# Patient Record
Sex: Female | Born: 1990
Health system: Southern US, Community
[De-identification: ages and names within clinical notes are randomized; demographics above are authoritative.]

## PROBLEM LIST (undated history)

## (undated) DIAGNOSIS — E079 Disorder of thyroid, unspecified: Secondary | ICD-10-CM

## (undated) DIAGNOSIS — F32A Depression, unspecified: Secondary | ICD-10-CM

## (undated) DIAGNOSIS — D649 Anemia, unspecified: Secondary | ICD-10-CM

## (undated) DIAGNOSIS — D126 Benign neoplasm of colon, unspecified: Secondary | ICD-10-CM

## (undated) DIAGNOSIS — F419 Anxiety disorder, unspecified: Secondary | ICD-10-CM

## (undated) HISTORY — DX: Anxiety disorder, unspecified: F41.9

## (undated) HISTORY — DX: Disorder of thyroid, unspecified: E07.9

## (undated) HISTORY — DX: Anemia, unspecified: D64.9

## (undated) HISTORY — PX: SMALL INTESTINE SURGERY: SHX150

## (undated) HISTORY — PX: NO PAST SURGERIES: SHX2092

## (undated) HISTORY — DX: Depression, unspecified: F32.A

## (undated) HISTORY — DX: Benign neoplasm of colon, unspecified: D12.6

---

## 2016-01-30 ENCOUNTER — Emergency Department (HOSPITAL_COMMUNITY): Payer: Self-pay

## 2016-01-30 ENCOUNTER — Encounter (HOSPITAL_COMMUNITY): Payer: Self-pay | Admitting: Emergency Medicine

## 2016-01-30 ENCOUNTER — Emergency Department (HOSPITAL_COMMUNITY)
Admission: EM | Admit: 2016-01-30 | Discharge: 2016-01-30 | Disposition: A | Discharge status: Home / Self Care | Payer: Self-pay | Attending: Emergency Medicine | Admitting: Emergency Medicine

## 2016-01-30 DIAGNOSIS — S20319A Abrasion of unspecified front wall of thorax, initial encounter: Secondary | ICD-10-CM | POA: Insufficient documentation

## 2016-01-30 DIAGNOSIS — F17200 Nicotine dependence, unspecified, uncomplicated: Secondary | ICD-10-CM | POA: Insufficient documentation

## 2016-01-30 DIAGNOSIS — M79642 Pain in left hand: Secondary | ICD-10-CM | POA: Insufficient documentation

## 2016-01-30 DIAGNOSIS — Y999 Unspecified external cause status: Secondary | ICD-10-CM | POA: Insufficient documentation

## 2016-01-30 DIAGNOSIS — S0083XA Contusion of other part of head, initial encounter: Secondary | ICD-10-CM | POA: Insufficient documentation

## 2016-01-30 DIAGNOSIS — Y939 Activity, unspecified: Secondary | ICD-10-CM | POA: Insufficient documentation

## 2016-01-30 DIAGNOSIS — T148XXA Other injury of unspecified body region, initial encounter: Secondary | ICD-10-CM

## 2016-01-30 DIAGNOSIS — Y929 Unspecified place or not applicable: Secondary | ICD-10-CM | POA: Insufficient documentation

## 2016-01-30 DIAGNOSIS — T07XXXA Unspecified multiple injuries, initial encounter: Secondary | ICD-10-CM

## 2016-01-30 DIAGNOSIS — M25521 Pain in right elbow: Secondary | ICD-10-CM | POA: Insufficient documentation

## 2016-01-30 DIAGNOSIS — W503XXA Accidental bite by another person, initial encounter: Secondary | ICD-10-CM

## 2016-01-30 MED ORDER — ACETAMINOPHEN 325 MG PO TABS
650.0000 mg | ORAL_TABLET | Freq: Once | ORAL | Status: AC
  Administered 2016-01-30: 650 mg via ORAL
  Filled 2016-01-30: qty 2

## 2016-01-30 MED ORDER — TETANUS-DIPHTH-ACELL PERTUSSIS 5-2.5-18.5 LF-MCG/0.5 IM SUSP
0.5000 mL | Freq: Once | INTRAMUSCULAR | Status: AC
  Administered 2016-01-30: 0.5 mL via INTRAMUSCULAR
  Filled 2016-01-30: qty 0.5

## 2016-01-30 MED ORDER — BACITRACIN ZINC 500 UNIT/GM EX OINT
1.0000 "application " | TOPICAL_OINTMENT | Freq: Once | CUTANEOUS | Status: AC
  Administered 2016-01-30: 1 via TOPICAL
  Filled 2016-01-30: qty 1.8
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
## 2016-01-30 NOTE — ED Notes (Signed)
Awake. Verbally responsive. A/O x4. Resp even and unlabored. No audible adventitious breath sounds noted. ABC's intact.  
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
## 2016-01-30 NOTE — ED Notes (Signed)
Cleanse abrasions to forehead and lt posterior shoulder with saline and applied bacitracin dsg. Pt tolerated well.
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
## 2016-06-08 ENCOUNTER — Encounter (HOSPITAL_COMMUNITY): Payer: Self-pay | Admitting: Family Medicine

## 2016-06-08 ENCOUNTER — Emergency Department (HOSPITAL_COMMUNITY)
Admission: EM | Admit: 2016-06-08 | Discharge: 2016-06-09 | Disposition: A | Discharge status: Home / Self Care | Payer: Self-pay | Attending: Emergency Medicine | Admitting: Emergency Medicine

## 2016-06-08 DIAGNOSIS — Z87891 Personal history of nicotine dependence: Secondary | ICD-10-CM | POA: Insufficient documentation

## 2016-06-08 DIAGNOSIS — H109 Unspecified conjunctivitis: Secondary | ICD-10-CM | POA: Insufficient documentation

## 2016-06-08 MED ORDER — FLUORESCEIN SODIUM 1 MG OP STRP
1.0000 | ORAL_STRIP | Freq: Once | OPHTHALMIC | Status: AC
  Administered 2016-06-08: 1 via OPHTHALMIC
  Filled 2016-06-08: qty 1

## 2016-06-08 MED ORDER — TETRACAINE HCL 0.5 % OP SOLN
1.0000 [drp] | Freq: Once | OPHTHALMIC | Status: AC
  Administered 2016-06-08: 1 [drp] via OPHTHALMIC
  Filled 2016-06-08: qty 2

## 2016-06-08 MED ORDER — TOBRAMYCIN 0.3 % OP SOLN
1.0000 [drp] | OPHTHALMIC | Status: DC
  Administered 2016-06-09: 1 [drp] via OPHTHALMIC
  Filled 2016-06-08: qty 5
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
## 2016-06-08 NOTE — ED Triage Notes (Signed)
Pt presents with bilateral erythematic conjunctiva x3-4 days, has tried OTC remedies without relief.
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
## 2019-02-06 DIAGNOSIS — Z1389 Encounter for screening for other disorder: Secondary | ICD-10-CM | POA: Diagnosis not present

## 2019-02-06 DIAGNOSIS — Z01419 Encounter for gynecological examination (general) (routine) without abnormal findings: Secondary | ICD-10-CM | POA: Diagnosis not present

## 2019-02-06 DIAGNOSIS — Z6823 Body mass index (BMI) 23.0-23.9, adult: Secondary | ICD-10-CM | POA: Diagnosis not present

## 2019-02-06 DIAGNOSIS — Z124 Encounter for screening for malignant neoplasm of cervix: Secondary | ICD-10-CM | POA: Diagnosis not present

## 2019-04-22 DIAGNOSIS — L292 Pruritus vulvae: Secondary | ICD-10-CM | POA: Diagnosis not present

## 2019-04-22 DIAGNOSIS — N898 Other specified noninflammatory disorders of vagina: Secondary | ICD-10-CM | POA: Diagnosis not present

## 2019-04-22 DIAGNOSIS — N760 Acute vaginitis: Secondary | ICD-10-CM | POA: Diagnosis not present

## 2019-05-13 DIAGNOSIS — Z8742 Personal history of other diseases of the female genital tract: Secondary | ICD-10-CM | POA: Diagnosis not present

## 2019-05-16 DIAGNOSIS — N7689 Other specified inflammation of vagina and vulva: Secondary | ICD-10-CM | POA: Diagnosis not present

## 2019-07-30 ENCOUNTER — Ambulatory Visit (INDEPENDENT_AMBULATORY_CARE_PROVIDER_SITE_OTHER): Payer: Self-pay | Admitting: Primary Care

## 2019-07-30 ENCOUNTER — Other Ambulatory Visit (HOSPITAL_COMMUNITY)
Admission: RE | Admit: 2019-07-30 | Discharge: 2019-07-30 | Disposition: A | Discharge status: Home / Self Care | Payer: Self-pay | Source: Ambulatory Visit | Attending: Primary Care | Admitting: Primary Care

## 2019-07-30 ENCOUNTER — Encounter (INDEPENDENT_AMBULATORY_CARE_PROVIDER_SITE_OTHER): Payer: Self-pay | Admitting: Primary Care

## 2019-07-30 ENCOUNTER — Other Ambulatory Visit: Payer: Self-pay

## 2019-07-30 VITALS — BP 125/79 | HR 88 | Temp 97.3°F | Ht 70.0 in | Wt 170.6 lb

## 2019-07-30 DIAGNOSIS — Z124 Encounter for screening for malignant neoplasm of cervix: Secondary | ICD-10-CM

## 2019-07-30 DIAGNOSIS — Z113 Encounter for screening for infections with a predominantly sexual mode of transmission: Secondary | ICD-10-CM

## 2019-07-30 DIAGNOSIS — Z0000 Encounter for general adult medical examination without abnormal findings: Secondary | ICD-10-CM

## 2019-07-30 DIAGNOSIS — Z01411 Encounter for gynecological examination (general) (routine) with abnormal findings: Secondary | ICD-10-CM

## 2019-07-30 DIAGNOSIS — N898 Other specified noninflammatory disorders of vagina: Secondary | ICD-10-CM

## 2019-07-30 NOTE — Patient Instructions (Signed)
Health Maintenance, Female Adopting a healthy lifestyle and getting preventive care are important in promoting health and wellness. Ask your health care provider about:  The right schedule for you to have regular tests and exams.  Things you can do on your own to prevent diseases and keep yourself healthy. What should I know about diet, weight, and exercise? Eat a healthy diet   Eat a diet that includes plenty of vegetables, fruits, low-fat dairy products, and lean protein.  Do not eat a lot of foods that are high in solid fats, added sugars, or sodium. Maintain a healthy weight Body mass index (BMI) is used to identify weight problems. It estimates body fat based on height and weight. Your health care provider can help determine your BMI and help you achieve or maintain a healthy weight. Get regular exercise Get regular exercise. This is one of the most important things you can do for your health. Most adults should:  Exercise for at least 150 minutes each week. The exercise should increase your heart rate and make you sweat (moderate-intensity exercise).  Do strengthening exercises at least twice a week. This is in addition to the moderate-intensity exercise.  Spend less time sitting. Even light physical activity can be beneficial. Watch cholesterol and blood lipids Have your blood tested for lipids and cholesterol at 26 years of age, then have this test every 5 years. Have your cholesterol levels checked more often if:  Your lipid or cholesterol levels are high.  You are older than 26 years of age.  You are at high risk for heart disease. What should I know about cancer screening? Depending on your health history and family history, you may need to have cancer screening at various ages. This may include screening for:  Breast cancer.  Cervical cancer.  Colorectal cancer.  Skin cancer.  Lung cancer. What should I know about heart disease, diabetes, and high blood  pressure? Blood pressure and heart disease  High blood pressure causes heart disease and increases the risk of stroke. This is more likely to develop in people who have high blood pressure readings, are of African descent, or are overweight.  Have your blood pressure checked: ? Every 3-5 years if you are 18-39 years of age. ? Every year if you are 40 years old or older. Diabetes Have regular diabetes screenings. This checks your fasting blood sugar level. Have the screening done:  Once every three years after age 40 if you are at a normal weight and have a low risk for diabetes.  More often and at a younger age if you are overweight or have a high risk for diabetes. What should I know about preventing infection? Hepatitis B If you have a higher risk for hepatitis B, you should be screened for this virus. Talk with your health care provider to find out if you are at risk for hepatitis B infection. Hepatitis C Testing is recommended for:  Everyone born from 1945 through 1965.  Anyone with known risk factors for hepatitis C. Sexually transmitted infections (STIs)  Get screened for STIs, including gonorrhea and chlamydia, if: ? You are sexually active and are younger than 26 years of age. ? You are older than 26 years of age and your health care provider tells you that you are at risk for this type of infection. ? Your sexual activity has changed since you were last screened, and you are at increased risk for chlamydia or gonorrhea. Ask your health care provider if   you are at risk.  Ask your health care provider about whether you are at high risk for HIV. Your health care provider may recommend a prescription medicine to help prevent HIV infection. If you choose to take medicine to prevent HIV, you should first get tested for HIV. You should then be tested every 3 months for as long as you are taking the medicine. Pregnancy  If you are about to stop having your period (premenopausal) and  you may become pregnant, seek counseling before you get pregnant.  Take 400 to 800 micrograms (mcg) of folic acid every day if you become pregnant.  Ask for birth control (contraception) if you want to prevent pregnancy. Osteoporosis and menopause Osteoporosis is a disease in which the bones lose minerals and strength with aging. This can result in bone fractures. If you are 65 years old or older, or if you are at risk for osteoporosis and fractures, ask your health care provider if you should:  Be screened for bone loss.  Take a calcium or vitamin D supplement to lower your risk of fractures.  Be given hormone replacement therapy (HRT) to treat symptoms of menopause. Follow these instructions at home: Lifestyle  Do not use any products that contain nicotine or tobacco, such as cigarettes, e-cigarettes, and chewing tobacco. If you need help quitting, ask your health care provider.  Do not use street drugs.  Do not share needles.  Ask your health care provider for help if you need support or information about quitting drugs. Alcohol use  Do not drink alcohol if: ? Your health care provider tells you not to drink. ? You are pregnant, may be pregnant, or are planning to become pregnant.  If you drink alcohol: ? Limit how much you use to 0-1 drink a day. ? Limit intake if you are breastfeeding.  Be aware of how much alcohol is in your drink. In the [place] one drink equals one 12 oz bottle of beer (355 mL), one 5 oz glass of wine (148 mL), or one 1 oz glass of hard liquor (44 mL). General instructions  Schedule regular health, dental, and eye exams.  Stay current with your vaccines.  Tell your health care provider if: ? You often feel depressed. ? You have ever been abused or do not feel safe at home. Summary  Adopting a healthy lifestyle and getting preventive care are important in promoting health and wellness.  Follow your health care provider's instructions about healthy  diet, exercising, and getting tested or screened for diseases.  Follow your health care provider's instructions on monitoring your cholesterol and blood pressure. This information is not intended to replace advice given to you by your health care provider. Make sure you discuss any questions you have with your health care provider. Document Released: 04/03/2011 Document Revised: 09/11/2018 Document Reviewed: 09/11/2018 Elsevier Patient Education  2020 [employer]  

## 2019-07-30 NOTE — Progress Notes (Signed)
New Patient Office Visit  Subjective:  Patient ID: Alexandria Frank, female    DOB: 11/24/1990  Age: 28 y.o. MRN: [id number]  CC:  Chief Complaint  Patient presents with  . New Patient (Initial Visit)    yeast infection    HPI Alexandria Frank presents for establishment of care and she has complaints of vaginal discharge and itching.  History reviewed. No pertinent past medical history.  History reviewed. No pertinent surgical history.  History reviewed. No pertinent family history.  Social History   Socioeconomic History  . Marital status: Single    Spouse name: Not on file  . Number of children: Not on file  . Years of education: Not on file  . Highest education level: Not on file  Occupational History  . Not on file  Social Needs  . Financial resource strain: Not on file  . Food insecurity    Worry: Not on file    Inability: Not on file  . Transportation needs    Medical: Not on file    Non-medical: Not on file  Tobacco Use  . Smoking status: Former Games developer  . Smokeless tobacco: Never Used  Substance and Sexual Activity  . Alcohol use: Yes  . Drug use: No  . Sexual activity: Yes  Lifestyle  . Physical activity    Days per week: Not on file    Minutes per session: Not on file  . Stress: Not on file  Relationships  . Social Musician on phone: Not on file    Gets together: Not on file    Attends religious service: Not on file    Active member of club or organization: Not on file    Attends meetings of clubs or organizations: Not on file    Relationship status: Not on file  . Intimate partner violence    Fear of current or ex partner: Not on file    Emotionally abused: Not on file    Physically abused: Not on file    Forced sexual activity: Not on file  Other Topics Concern  . Not on file  Social History Narrative  . Not on file    ROS Review of Systems  Genitourinary: Positive for vaginal discharge.  All other systems reviewed and are  negative.   Objective:   Today's Vitals: BP 125/79 (BP Location: Left Arm, Patient Position: Sitting, Cuff Size: Normal)   Pulse 88   Temp (!) 97.3 F (36.3 C) (Temporal)   Ht 5\' 10"  (1.778 m)   Wt 170 lb 9.6 oz (77.4 kg)   LMP 07/22/2019   SpO2 94%   Breastfeeding No   BMI 24.48 kg/m   Physical Exam CONSTITUTIONAL: Well-developed, well-nourished female in no acute distress.  HENT:  Normocephalic, atraumatic, External right and left ear normal. Oropharynx is clear and moist EYES: Conjunctivae and EOM are normal. Pupils are equal, round, and reactive to light. No scleral icterus.  NECK: Normal range of motion, supple, no masses.  Normal thyroid.  SKIN: Skin is warm and dry. No rash noted. Not diaphoretic. No erythema. No pallor. NEUROLGIC: Alert and oriented to person, place, and time. Normal reflexes, muscle tone coordination. No cranial nerve deficit noted. PSYCHIATRIC: Normal Frank and affect. Normal behavior. Normal judgment and thought content. CARDIOVASCULAR: Normal heart rate noted, regular rhythm RESPIRATORY: Clear to auscultation bilaterally. Effort and breath sounds normal, no problems with respiration noted. BREASTS:Taught SBE ABDOMEN: Soft, normal bowel sounds, no distention noted.  No tenderness,  rebound or guarding.  PELVIC: Normal appearing external genitalia; normal appearing vaginal mucosa and cervix. Scant amount of clumpy white abnormal discharge noted.  Pap smear obtained.  Normal uterine size, no other palpable masses, no uterine or adnexal tenderness. MUSCULOSKELETAL: Normal range of motion. No tenderness.  No cyanosis, clubbing, or edema.   Assessment & Plan:  Alexandria Frank was seen today for new patient (initial visit).  Diagnoses and all orders for this visit:  Encounter for medical examination to establish care Juluis Mire, NP-C (mastered prepared) will be your  (PCP) diagnosed and undiagnosed health concern as well as continuing care of varied medical  conditions, not limited by cause, organ system, or diagnosis. Patient verbalizes understanding. -     Cervicovaginal ancillary only -     CBC with Differential -     Comprehensive metabolic panel  Cervical cancer screening Recommended by USPSTF every 3 years unless warranted yearly. -     Cytology - PAP([place])  Screen for STD (sexually transmitted disease) -     Cervicovaginal ancillary only -     HIV antibody (with reflex)     Follow-up: Return if symptoms worsen or fail to improve.   Kerin Perna, NP

## 2019-07-31 LAB — COMPREHENSIVE METABOLIC PANEL
ALT: 9 IU/L (ref 0–32)
AST: 17 IU/L (ref 0–40)
Albumin/Globulin Ratio: 1.7 (ref 1.2–2.2)
Albumin: 4.5 g/dL (ref 3.9–5.0)
Alkaline Phosphatase: 94 IU/L (ref 39–117)
BUN/Creatinine Ratio: 10 (ref 9–23)
BUN: 7 mg/dL (ref 6–20)
Bilirubin Total: 0.6 mg/dL (ref 0.0–1.2)
CO2: 22 mmol/L (ref 20–29)
Calcium: 9.5 mg/dL (ref 8.7–10.2)
Chloride: 108 mmol/L — ABNORMAL HIGH (ref 96–106)
Creatinine, Ser: 0.7 mg/dL (ref 0.57–1.00)
GFR calc Af Amer: 138 mL/min/{1.73_m2} (ref >59)
GFR calc non Af Amer: 120 mL/min/{1.73_m2} (ref >59)
Globulin, Total: 2.7 g/dL (ref 1.5–4.5)
Glucose: 103 mg/dL — ABNORMAL HIGH (ref 65–99)
Potassium: 4.4 mmol/L (ref 3.5–5.2)
Sodium: 142 mmol/L (ref 134–144)
Total Protein: 7.2 g/dL (ref 6.0–8.5)

## 2019-07-31 LAB — CBC WITH DIFFERENTIAL/PLATELET
Basophils Absolute: 0 10*3/uL (ref 0.0–0.2)
Basos: 1 %
EOS (ABSOLUTE): 0.2 10*3/uL (ref 0.0–0.4)
Eos: 2 %
Hematocrit: 34.3 % (ref 34.0–46.6)
Hemoglobin: 11.1 g/dL (ref 11.1–15.9)
Immature Grans (Abs): 0 10*3/uL (ref 0.0–0.1)
Immature Granulocytes: 0 %
Lymphocytes Absolute: 2.6 10*3/uL (ref 0.7–3.1)
Lymphs: 33 %
MCH: 25.7 pg — ABNORMAL LOW (ref 26.6–33.0)
MCHC: 32.4 g/dL (ref 31.5–35.7)
MCV: 79 fL (ref 79–97)
Monocytes Absolute: 0.7 10*3/uL (ref 0.1–0.9)
Monocytes: 8 %
Neutrophils Absolute: 4.5 10*3/uL (ref 1.4–7.0)
Neutrophils: 56 %
Platelets: 394 10*3/uL (ref 150–450)
RBC: 4.32 x10E6/uL (ref 3.77–5.28)
RDW: 15.4 % (ref 11.7–15.4)
WBC: 8 10*3/uL (ref 3.4–10.8)

## 2019-07-31 LAB — HIV ANTIBODY (ROUTINE TESTING W REFLEX): HIV Screen 4th Generation wRfx: NONREACTIVE

## 2019-08-01 ENCOUNTER — Telehealth (INDEPENDENT_AMBULATORY_CARE_PROVIDER_SITE_OTHER): Payer: Self-pay

## 2019-08-01 LAB — CERVICOVAGINAL ANCILLARY ONLY
Bacterial Vaginitis (gardnerella): NEGATIVE
Candida Glabrata: NEGATIVE
Candida Vaginitis: NEGATIVE
Chlamydia: NEGATIVE
Comment: NEGATIVE
Comment: NEGATIVE
Comment: NEGATIVE
Comment: NEGATIVE
Comment: NEGATIVE
Comment: NORMAL
Neisseria Gonorrhea: NEGATIVE
Trichomonas: NEGATIVE

## 2019-08-01 NOTE — Telephone Encounter (Signed)
-----   Message from Kerin Perna, NP sent at 08/01/2019 11:05 AM EDT ----- I have reviewed all labs and they are normal

## 2019-08-01 NOTE — Telephone Encounter (Signed)
Patient returned call. She is aware that labs are normal. Will call with pap results once they are available. Nat Christen, CMA

## 2019-08-04 LAB — CYTOLOGY - PAP: Diagnosis: NEGATIVE

## 2019-08-05 ENCOUNTER — Telehealth (INDEPENDENT_AMBULATORY_CARE_PROVIDER_SITE_OTHER): Payer: Self-pay

## 2019-08-05 NOTE — Telephone Encounter (Signed)
Patient is aware that STD and pap are negative. Nat Christen, CMA

## 2019-08-05 NOTE — Telephone Encounter (Signed)
-----   Message from Kerin Perna, NP sent at 08/05/2019  1:41 PM EST ----- Pap results negative

## 2019-09-08 ENCOUNTER — Other Ambulatory Visit: Payer: Self-pay

## 2019-09-08 DIAGNOSIS — Z20822 Contact with and (suspected) exposure to covid-19: Secondary | ICD-10-CM

## 2019-09-09 LAB — NOVEL CORONAVIRUS, NAA: SARS-CoV-2, NAA: NOT DETECTED

## 2019-11-11 ENCOUNTER — Ambulatory Visit (INDEPENDENT_AMBULATORY_CARE_PROVIDER_SITE_OTHER): Payer: Self-pay | Admitting: Primary Care

## 2019-12-01 ENCOUNTER — Ambulatory Visit (INDEPENDENT_AMBULATORY_CARE_PROVIDER_SITE_OTHER): Payer: Self-pay | Admitting: Primary Care

## 2019-12-01 DIAGNOSIS — Z20828 Contact with and (suspected) exposure to other viral communicable diseases: Secondary | ICD-10-CM | POA: Diagnosis not present

## 2019-12-03 ENCOUNTER — Ambulatory Visit (INDEPENDENT_AMBULATORY_CARE_PROVIDER_SITE_OTHER): Payer: Self-pay | Admitting: Primary Care

## 2019-12-11 ENCOUNTER — Ambulatory Visit (INDEPENDENT_AMBULATORY_CARE_PROVIDER_SITE_OTHER): Payer: Self-pay | Admitting: Primary Care

## 2019-12-15 ENCOUNTER — Other Ambulatory Visit: Payer: Self-pay

## 2019-12-15 ENCOUNTER — Encounter (INDEPENDENT_AMBULATORY_CARE_PROVIDER_SITE_OTHER): Payer: Self-pay | Admitting: Primary Care

## 2019-12-15 ENCOUNTER — Ambulatory Visit (INDEPENDENT_AMBULATORY_CARE_PROVIDER_SITE_OTHER): Payer: Self-pay | Admitting: Primary Care

## 2019-12-15 VITALS — BP 147/95 | HR 148 | Temp 98.0°F | Ht 70.0 in | Wt 157.2 lb

## 2019-12-15 DIAGNOSIS — F411 Generalized anxiety disorder: Secondary | ICD-10-CM

## 2019-12-15 DIAGNOSIS — K5900 Constipation, unspecified: Secondary | ICD-10-CM

## 2019-12-15 DIAGNOSIS — R112 Nausea with vomiting, unspecified: Secondary | ICD-10-CM

## 2019-12-15 DIAGNOSIS — E049 Nontoxic goiter, unspecified: Secondary | ICD-10-CM

## 2019-12-15 DIAGNOSIS — R000 Tachycardia, unspecified: Secondary | ICD-10-CM

## 2019-12-15 MED ORDER — METOPROLOL TARTRATE 25 MG PO TABS: 25.0000 mg | ORAL_TABLET | Freq: Two times a day (BID) | ORAL | 3 refills | Status: DC

## 2019-12-15 NOTE — Patient Instructions (Addendum)
Generalized Anxiety Disorder, Adult Generalized anxiety disorder (GAD) is a mental health disorder. People with this condition constantly worry about everyday events. Unlike normal anxiety, worry related to GAD is not triggered by a specific event. These worries also do not fade or get better with time. GAD interferes with life functions, including relationships, work, and school. GAD can vary from mild to severe. People with severe GAD can have intense waves of anxiety with physical symptoms (panic attacks). What are the causes? The exact cause of GAD is not known. What increases the risk? This condition is more likely to develop in:  Women.  People who have a family history of anxiety disorders.  People who are very shy.  People who experience very stressful life events, such as the death of a loved one.  People who have a very stressful family environment. What are the signs or symptoms? People with GAD often worry excessively about many things in their lives, such as their health and family. They may also be overly concerned about:  Doing well at work.  Being on time.  Natural disasters.  Friendships. Physical symptoms of GAD include:  Fatigue.  Muscle tension or having muscle twitches.  Trembling or feeling shaky.  Being easily startled.  Feeling like your heart is pounding or racing.  Feeling out of breath or like you cannot take a deep breath.  Having trouble falling asleep or staying asleep.  Sweating.  Nausea, diarrhea, or irritable bowel syndrome (IBS).  Headaches.  Trouble concentrating or remembering facts.  Restlessness.  Irritability. How is this diagnosed? Your health care provider can diagnose GAD based on your symptoms and medical history. You will also have a physical exam. The health care provider will ask specific questions about your symptoms, including how severe they are, when they started, and if they come and go. Your health care  provider may ask you about your use of alcohol or drugs, including prescription medicines. Your health care provider may refer you to a mental health specialist for further evaluation. Your health care provider will do a thorough examination and may perform additional tests to rule out other possible causes of your symptoms. To be diagnosed with GAD, a person must have anxiety that:  Is out of his or her control.  Affects several different aspects of his or her life, such as work and relationships.  Causes distress that makes him or her unable to take part in normal activities.  Includes at least three physical symptoms of GAD, such as restlessness, fatigue, trouble concentrating, irritability, muscle tension, or sleep problems. Before your health care provider can confirm a diagnosis of GAD, these symptoms must be present more days than they are not, and they must last for six months or longer. How is this treated? The following therapies are usually used to treat GAD:  Medicine. Antidepressant medicine is usually prescribed for long-term daily control. Antianxiety medicines may be added in severe cases, especially when panic attacks occur.  Talk therapy (psychotherapy). Certain types of talk therapy can be helpful in treating GAD by providing support, education, and guidance. Options include: ? Cognitive behavioral therapy (CBT). People learn coping skills and techniques to ease their anxiety. They learn to identify unrealistic or negative thoughts and behaviors and to replace them with positive ones. ? Acceptance and commitment therapy (ACT). This treatment teaches people how to be mindful as a way to cope with unwanted thoughts and feelings. ? Biofeedback. This process trains you to manage your body's response (  physiological response) through breathing techniques and relaxation methods. You will work with a therapist while machines are used to monitor your physical symptoms.  Stress  management techniques. These include yoga, meditation, and exercise. A mental health specialist can help determine which treatment is best for you. Some people see improvement with one type of therapy. However, other people require a combination of therapies. Follow these instructions at home:  Take over-the-counter and prescription medicines only as told by your health care provider.  Try to maintain a normal routine.  Try to anticipate stressful situations and allow extra time to manage them.  Practice any stress management or self-calming techniques as taught by your health care provider.  Do not punish yourself for setbacks or for not making progress.  Try to recognize your accomplishments, even if they are small.  Keep all follow-up visits as told by your health care provider. This is important. Contact a health care provider if:  Your symptoms do not get better.  Your symptoms get worse.  You have signs of depression, such as: ? A persistently sad, cranky, or irritable mood. ? Loss of enjoyment in activities that used to bring you joy. ? Change in weight or eating. ? Changes in sleeping habits. ? Avoiding friends or family members. ? Loss of energy for normal tasks. ? Feelings of guilt or worthlessness. Get help right away if:  You have serious thoughts about hurting yourself or others. If you ever feel like you may hurt yourself or others, or have thoughts about taking your own life, get help right away. You can go to your nearest emergency department or call:  Your local emergency services (911 in the [place]  A suicide crisis helpline, such as the National Suicide Prevention Lifeline at (239) 391-4550. This is open 24 hours a day. Summary  Generalized anxiety disorder (GAD) is a mental health disorder that involves worry that is not triggered by a specific event.  People with GAD often worry excessively about many things in their lives, such as their health and  family.  GAD may cause physical symptoms such as restlessness, trouble concentrating, sleep problems, frequent sweating, nausea, diarrhea, headaches, and trembling or muscle twitching.  A mental health specialist can help determine which treatment is best for you. Some people see improvement with one type of therapy. However, other people require a combination of therapies. This information is not intended to replace advice given to you by your health care provider. Make sure you discuss any questions you have with your health care provider. Document Revised: 08/31/2017 Document Reviewed: 08/08/2016 Elsevier Patient Education  2020 Elsevier Inc.  MANAGEMENT OF CHRONIC CONSTIPATION   Drink fluids in the recommended amount everyday. Recommend amount is 8 cups of water daily. Do not replace water with Gatorade or Powerade as these should only be used when you are dehydrated.   Eat lots of high fiber foods-fruits, veggies, bran and whole grain instead of white bread  Be active everyday. Inactivity makes constipation worse.  Add psyllium daily (Metamucil) which comes in capsules now. Start very low dose and work up to recommended dose on bottle daily.  Stay away from Milk of Magnesia or any magnesium containing laxative, unless you need it to clear things out rarely. It is an addictive laxative and your gut will become dependent on it.  If that is not working, I would start Miralax, which you can buy in generic 17 gms daily. It's a powder and not an "addictive laxative". Take it every  day and titrate the dose up or down to get the daily Bm.  We will consider the use of other pharmacological treatments should the above recommendations prove to be unsuccessful.

## 2019-12-15 NOTE — Progress Notes (Signed)
Established Patient Office Visit  Subjective:  Patient ID: Alexandria Frank, female    DOB: 07-29-1991  Age: 29 y.o. MRN: [id number]  CC:  Chief Complaint  Patient presents with  . Tachycardia  . low iron    HPI Alexandria Frank presents for establishment of care and racing heart. She has concerns with anxiety, constipation and constipation. All will be address   History reviewed. No pertinent past medical history.  History reviewed. No pertinent surgical history.  History reviewed. No pertinent family history.  Social History   Socioeconomic History  . Marital status: Single    Spouse name: Not on file  . Number of children: Not on file  . Years of education: Not on file  . Highest education level: Not on file  Occupational History  . Not on file  Tobacco Use  . Smoking status: Former Games developer  . Smokeless tobacco: Never Used  Substance and Sexual Activity  . Alcohol use: Yes  . Drug use: No  . Sexual activity: Yes  Other Topics Concern  . Not on file  Social History Narrative  . Not on file   Social Determinants of Health   Financial Resource Strain:   . Difficulty of Paying Living Expenses:   Food Insecurity:   . Worried About Programme researcher, broadcasting/film/video in the Last Year:   . Barista in the Last Year:   Transportation Needs:   . Freight forwarder (Medical):   Marland Kitchen Lack of Transportation (Non-Medical):   Physical Activity:   . Days of Exercise per Week:   . Minutes of Exercise per Session:   Stress:   . Feeling of Stress :   Social Connections:   . Frequency of Communication with Friends and Family:   . Frequency of Social Gatherings with Friends and Family:   . Attends Religious Services:   . Active Member of Clubs or Organizations:   . Attends Banker Meetings:   Marland Kitchen Marital Status:   Intimate Partner Violence:   . Fear of Current or Ex-Partner:   . Emotionally Abused:   Marland Kitchen Physically Abused:   . Sexually Abused:     No outpatient  medications prior to visit.   No facility-administered medications prior to visit.    No Known Allergies  ROS Review of Systems  Respiratory: Positive for shortness of breath.        Anxiety   Cardiovascular: Positive for palpitations.  Gastrointestinal: Positive for constipation and nausea.  Genitourinary: Positive for menstrual problem.       Clotting   Psychiatric/Behavioral: Positive for agitation and sleep disturbance. The patient is nervous/anxious.   All other systems reviewed and are negative.     Objective:    Physical Exam  Constitutional: She is oriented to person, place, and time. She appears well-developed and well-nourished.  HENT:  Head: Normocephalic.  Eyes: Pupils are equal, round, and reactive to light. EOM are normal.  Cardiovascular: Normal rate and regular rhythm.  Pulmonary/Chest: Effort normal and breath sounds normal.  Abdominal: Bowel sounds are normal.  Musculoskeletal:        General: Normal range of motion.     Cervical back: Normal range of motion and neck supple.  Neurological: She is alert and oriented to person, place, and time. She has normal reflexes.  Skin: Skin is warm and dry.  Psychiatric: She has a normal mood and affect. Her behavior is normal. Judgment and thought content normal.    BP Marland Kitchen)  147/95 (BP Location: Left Arm, Patient Position: Sitting, Cuff Size: Normal)   Pulse (!) 148   Temp 98 F (36.7 C) (Temporal)   Ht 5\' 10"  (1.778 m)   Wt 157 lb 3.2 oz (71.3 kg)   LMP 11/12/2019 (Approximate)   SpO2 97%   BMI 22.56 kg/m  Wt Readings from Last 3 Encounters:  12/15/19 157 lb 3.2 oz (71.3 kg)  07/30/19 170 lb 9.6 oz (77.4 kg)     There are no preventive care reminders to display for this patient.  There are no preventive care reminders to display for this patient.  No results found for: TSH Lab Results  Component Value Date   WBC 8.0 07/30/2019   HGB 11.1 07/30/2019   HCT 34.3 07/30/2019   MCV 79 07/30/2019   PLT  394 07/30/2019   Lab Results  Component Value Date   NA 142 07/30/2019   K 4.4 07/30/2019   CO2 22 07/30/2019   GLUCOSE 103 (H) 07/30/2019   BUN 7 07/30/2019   CREATININE 0.70 07/30/2019   BILITOT 0.6 07/30/2019   ALKPHOS 94 07/30/2019   AST 17 07/30/2019   ALT 9 07/30/2019   PROT 7.2 07/30/2019   ALBUMIN 4.5 07/30/2019   CALCIUM 9.5 07/30/2019     Assessment & Plan:  Alexandria Frank was seen today for tachycardia and low iron.  Diagnoses and all orders for this visit:  Tachycardia with heart rate 121-140 beats per minute Normal resting heart rate 60-100 beats greater than that is tachycardia rule out underlying conditions ,sinus tachycardia , atrial fibrillation , thyroid disorder with underlying feeling of palpitations will start a beta blocker and do blood work. . Nausea and vomiting, intractability of vomiting not specified, unspecified vomiting type Causes not related to food does have bouts with constipation . Underlying cause.   Generalized anxiety disorder  We discussed options for treatment of anxiety including therapy and/or medication.  Will check basic labs to ensure thyroid is in normal range and that no other metabolic issues are obvious.  Reviewed concept of anxiety as biochemical imbalance of neurotransmitters and rationale for treatment. Discussed potential risks, expected benefits, possible side effects of the medicine. We also discussed how to take it correctly and dosing instructions. If she has any significant side effects to the medicine, she is to stop it and call for advice.  Instructed patient to contact office or on-call physician promptly should condition worsen or any new symptoms appear.    She was agreeable with this plan.   Spent 25 minutes (>50% of visit) discussing the risks of anxiety disorder, the pathophysiology, etiology, risks, and principles of treatment.   Constipation, unspecified constipation type MANAGEMENT OF CHRONIC  CONSTIPATION   Drink fluids in the recommended amount everyday. Recommend amount is 8 cups of water daily. Do not replace water with Gatorade or Powerade as these should only be used when you are dehydrated.   Eat lots of high fiber foods-fruits, veggies, bran and whole grain instead of white bread  Be active everyday. Inactivity makes constipation worse.  Add psyllium daily (Metamucil) which comes in capsules now. Start very low dose and work up to recommended dose on bottle daily.  Stay away from Milk of Magnesia or any magnesium containing laxative, unless you need it to clear things out rarely. It is an addictive laxative and your gut will become dependent on it.  If that is not working, I would start Miralax, which you can buy in generic 17 gms daily.  It's a powder and not an "addictive laxative". Take it every day and titrate the dose up or down to get the daily Bm.  We will consider the use of other pharmacological treatments should the above recommendations prove to be unsuccessful.   Enlarged thyroid gland Rule out thyroid disorder -     TSH + free T4  Other orders -     metoprolol tartrate (LOPRESSOR) 25 MG tablet; Take 1 tablet (25 mg total) by mouth 2 (two) times daily.    Meds ordered this encounter  Medications  . metoprolol tartrate (LOPRESSOR) 25 MG tablet    Sig: Take 1 tablet (25 mg total) by mouth 2 (two) times daily.    Dispense:  180 tablet    Refill:  3    Follow-up: Return for CSW in person anxiety and blood pressure follow up (2-3 weeks.    Kerin Perna, NP

## 2019-12-16 LAB — TSH+FREE T4
Free T4: >7.77 ng/dL — ABNORMAL HIGH (ref 0.82–1.77)
TSH: <0.005 u[IU]/mL — ABNORMAL LOW (ref 0.450–4.500)

## 2019-12-17 ENCOUNTER — Other Ambulatory Visit (INDEPENDENT_AMBULATORY_CARE_PROVIDER_SITE_OTHER): Payer: Self-pay | Admitting: Primary Care

## 2019-12-17 DIAGNOSIS — R000 Tachycardia, unspecified: Secondary | ICD-10-CM

## 2019-12-23 ENCOUNTER — Other Ambulatory Visit: Payer: Self-pay

## 2019-12-23 ENCOUNTER — Telehealth (INDEPENDENT_AMBULATORY_CARE_PROVIDER_SITE_OTHER): Payer: Self-pay | Admitting: Licensed Clinical Social Worker

## 2019-12-23 ENCOUNTER — Ambulatory Visit (INDEPENDENT_AMBULATORY_CARE_PROVIDER_SITE_OTHER): Payer: Self-pay | Admitting: Licensed Clinical Social Worker

## 2019-12-23 DIAGNOSIS — F411 Generalized anxiety disorder: Secondary | ICD-10-CM

## 2019-12-23 NOTE — Telephone Encounter (Signed)
Call placed regarding scheduled IBH appointment. No answer and voicemail was not set up; therefore, LCSW was unable to request return call.

## 2019-12-23 NOTE — BH Specialist Note (Signed)
Integrated Behavioral Health Visit via Telemedicine (Telephone)  12/23/2019 Alexandria Frank [id number]   Session Start time: 9:05 AM  Session End time: 9:40 AM Total time: 35   Referring Provider: NP Randa Evens Type of Visit: Telephonic Patient location: Home [facility] Provider location: Office All persons participating in visit: Pt and LCSW  Confirmed patient's address: Yes  Confirmed patient's phone number: Yes  Any changes to demographics: No   Confirmed patient's insurance: Yes  Any changes to patient's insurance: No   Discussed confidentiality: Yes    The following statements were read to the patient and/or legal guardian that are established with the [facility] Provider.  "The purpose of this phone visit is to provide behavioral health care while limiting exposure to the coronavirus (COVID19).  There is a possibility of technology failure and discussed alternative modes of communication if that failure occurs."  "By engaging in this telephone visit, you consent to the provision of healthcare.  Additionally, you authorize for your insurance to be billed for the services provided during this telephone visit."   Patient and/or legal guardian consented to telephone visit: Yes   PRESENTING CONCERNS: Patient and/or family reports the following symptoms/concerns: Pt reports increase in anxiety symptoms including, picking at lip/fingers, racing thoughts, difficulty sleeping, irritability, sadness, and decreased appetite Duration of problem: 2 months; Severity of problem: severe  STRENGTHS (Protective Factors/Coping Skills): Pt is employed Pt has good insight Pt has strong support system  GOALS ADDRESSED: Patient will: 1.  Reduce symptoms of: anxiety and depression  2.  Increase knowledge and/or ability of: coping skills and healthy habits  3.  Demonstrate ability to: Increase healthy adjustment to current life circumstances  INTERVENTIONS: Interventions utilized:   Solution-Focused Strategies, Supportive Counseling and Psychoeducation and/or Health Education Standardized Assessments completed: Not Needed  ASSESSMENT: Patient currently experiencing increase in depression and anxiety symptoms. She receives support from family and girlfriend. Denies SI/HI.  Patient may benefit from medication management and brief therapy. LCSW provided validation and support. Healthy coping skills were discussed. Pt will send employment paperwork to provider and discuss initiation of medications to assist in decreasing/managing symptoms  PLAN: 1. Follow up with behavioral health clinician on : Contact LCSW with any additional behavioral health and/or resource needs 2. Behavioral recommendations: Utilize strategies discussed and speak to PCP regarding paperwork 3. Referral(s): [facility] [employer] (In Clinic)  Bridgett Larsson, [place] 01/15/2020 6:51 AM

## 2019-12-29 ENCOUNTER — Other Ambulatory Visit: Payer: Self-pay

## 2019-12-29 ENCOUNTER — Ambulatory Visit (INDEPENDENT_AMBULATORY_CARE_PROVIDER_SITE_OTHER): Payer: BC Managed Care – PPO | Admitting: Primary Care

## 2019-12-29 ENCOUNTER — Encounter (INDEPENDENT_AMBULATORY_CARE_PROVIDER_SITE_OTHER): Payer: Self-pay | Admitting: Primary Care

## 2019-12-29 VITALS — BP 134/89 | HR 144 | Temp 97.3°F | Ht 70.0 in | Wt 151.4 lb

## 2019-12-29 DIAGNOSIS — E0590 Thyrotoxicosis, unspecified without thyrotoxic crisis or storm: Secondary | ICD-10-CM | POA: Diagnosis not present

## 2019-12-29 DIAGNOSIS — E049 Nontoxic goiter, unspecified: Secondary | ICD-10-CM

## 2019-12-29 MED ORDER — PROPRANOLOL HCL 10 MG PO TABS: 10.0000 mg | ORAL_TABLET | Freq: Three times a day (TID) | ORAL | 0 refills | Status: DC

## 2019-12-29 MED ORDER — METHIMAZOLE 5 MG PO TABS: 5.0000 mg | ORAL_TABLET | Freq: Two times a day (BID) | ORAL | 0 refills | Status: DC

## 2019-12-29 NOTE — Progress Notes (Signed)
Acute Office Visit  Subjective:    Patient ID: Alexandria Frank, female    DOB: 1991-02-15, 29 y.o.   MRN: [id number]  Chief Complaint  Patient presents with  . Blood Pressure Check  . forms    HPI Patient is in today for follow up on new diagnosis of hyperthyroidism. No improvement with tachycardia on beta blocker. Discussed patient with Dr. Joya Gaskins for treatment until seen by endocrinologist.  History reviewed. No pertinent past medical history.  History reviewed. No pertinent surgical history.  History reviewed. No pertinent family history.  Social History   Socioeconomic History  . Marital status: Single    Spouse name: Not on file  . Number of children: Not on file  . Years of education: Not on file  . Highest education level: Not on file  Occupational History  . Not on file  Tobacco Use  . Smoking status: Former Research scientist (life sciences)  . Smokeless tobacco: Never Used  Substance and Sexual Activity  . Alcohol use: Yes  . Drug use: No  . Sexual activity: Yes  Other Topics Concern  . Not on file  Social History Narrative  . Not on file   Social Determinants of Health   Financial Resource Strain:   . Difficulty of Paying Living Expenses:   Food Insecurity:   . Worried About Charity fundraiser in the Last Year:   . Arboriculturist in the Last Year:   Transportation Needs:   . Film/video editor (Medical):   Marland Kitchen Lack of Transportation (Non-Medical):   Physical Activity:   . Days of Exercise per Week:   . Minutes of Exercise per Session:   Stress:   . Feeling of Stress :   Social Connections:   . Frequency of Communication with Friends and Family:   . Frequency of Social Gatherings with Friends and Family:   . Attends Religious Services:   . Active Member of Clubs or Organizations:   . Attends Archivist Meetings:   Marland Kitchen Marital Status:   Intimate Partner Violence:   . Fear of Current or Ex-Partner:   . Emotionally Abused:   Marland Kitchen Physically Abused:   . Sexually  Abused:     Outpatient Medications Prior to Visit  Medication Sig Dispense Refill  . metoprolol tartrate (LOPRESSOR) 25 MG tablet Take 1 tablet (25 mg total) by mouth 2 (two) times daily. 180 tablet 3   No facility-administered medications prior to visit.    No Known Allergies  Review of Systems  Constitutional: Positive for activity change, appetite change and unexpected weight change.  Cardiovascular: Positive for palpitations.  Endocrine: Positive for heat intolerance.  Psychiatric/Behavioral: Positive for agitation. The patient is nervous/anxious.        Objective:    Physical Exam Vitals reviewed.  Constitutional:      Appearance: Normal appearance. She is normal weight.  HENT:     Head: Normocephalic.     Right Ear: Tympanic membrane normal.     Left Ear: Tympanic membrane normal.  Cardiovascular:     Rate and Rhythm: Tachycardia present.     Heart sounds: Normal heart sounds.  Pulmonary:     Effort: Pulmonary effort is normal.     Breath sounds: Normal breath sounds.  Abdominal:     General: Abdomen is flat. Bowel sounds are normal.  Musculoskeletal:        General: Normal range of motion.     Cervical back: Normal range of motion.  Neurological:  Mental Status: She is alert and oriented to person, place, and time.  Psychiatric:        Mood and Affect: Mood normal.        Judgment: Judgment normal.     BP 134/89 (BP Location: Right Arm, Patient Position: Sitting, Cuff Size: Normal)   Pulse (!) 144   Temp (!) 97.3 F (36.3 C) (Temporal)   Ht 5\' 10"  (1.778 m)   Wt 151 lb 6.4 oz (68.7 kg)   LMP 12/16/2019 (Exact Date)   SpO2 99%   BMI 21.72 kg/m  Wt Readings from Last 3 Encounters:  12/29/19 151 lb 6.4 oz (68.7 kg)  12/15/19 157 lb 3.2 oz (71.3 kg)  07/30/19 170 lb 9.6 oz (77.4 kg)    There are no preventive care reminders to display for this patient.  There are no preventive care reminders to display for this patient.   Lab Results   Component Value Date   TSH <0.005 (L) 12/15/2019   Lab Results  Component Value Date   WBC 8.0 07/30/2019   HGB 11.1 07/30/2019   HCT 34.3 07/30/2019   MCV 79 07/30/2019   PLT 394 07/30/2019   Lab Results  Component Value Date   NA 142 07/30/2019   K 4.4 07/30/2019   CO2 22 07/30/2019   GLUCOSE 103 (H) 07/30/2019   BUN 7 07/30/2019   CREATININE 0.70 07/30/2019   BILITOT 0.6 07/30/2019   ALKPHOS 94 07/30/2019   AST 17 07/30/2019   ALT 9 07/30/2019   PROT 7.2 07/30/2019   ALBUMIN 4.5 07/30/2019   CALCIUM 9.5 07/30/2019   No results found for: CHOL No results found for: HDL No results found for: LDLCALC No results found for: TRIG No results found for: CHOLHDL No results found for: 08/01/2019     Assessment & Plan:  Alexandria Frank was seen today for blood pressure check and forms.  Diagnoses and all orders for this visit:  Enlarged thyroid gland Explain new diagnosis and what hyperthyroidism is  (overactive thyroid) occurs when your thyroid gland produces too much of the hormone thyroxine. Hyperthyroidism can accelerate your body's metabolism, causing unintentional weight loss and a rapid or irregular heartbeat -     Ambulatory referral to Endocrinology  Hyperthyroidism Discussed with Dr. Romeo Rabon for treatment plan discontinue metoprolol change to inderal 10mg  three times a day and Tapazole 5mg  twice daily prescribe enough to be seen by endo. -     Ambulatory referral to Endocrinology  Other orders -     methimazole (TAPAZOLE) 5 MG tablet; Take 1 tablet (5 mg total) by mouth 2 (two) times daily. -     propranolol (INDERAL) 10 MG tablet; Take 1 tablet (10 mg total) by mouth 3 (three) times daily.     Meds ordered this encounter  Medications  . methimazole (TAPAZOLE) 5 MG tablet    Sig: Take 1 tablet (5 mg total) by mouth 2 (two) times daily.    Dispense:  60 tablet    Refill:  0  . propranolol (INDERAL) 10 MG tablet    Sig: Take 1 tablet (10 mg total) by mouth 3 (three)  times daily.    Dispense:  90 tablet    Refill:  0     Delford Field, NP

## 2019-12-29 NOTE — Patient Instructions (Signed)

## 2019-12-31 ENCOUNTER — Telehealth (INDEPENDENT_AMBULATORY_CARE_PROVIDER_SITE_OTHER): Payer: Self-pay

## 2019-12-31 ENCOUNTER — Encounter (INDEPENDENT_AMBULATORY_CARE_PROVIDER_SITE_OTHER): Payer: Self-pay | Admitting: Primary Care

## 2019-12-31 NOTE — Telephone Encounter (Signed)
Patient recently had forms completed to work from home. Her job is requesting a letter also that provides a medical reasoning as to why. Once letter is done please print. Will call and notify patient when it is ready for pick up. Maryjean Morn, CMA

## 2020-01-08 ENCOUNTER — Telehealth (INDEPENDENT_AMBULATORY_CARE_PROVIDER_SITE_OTHER): Payer: Self-pay

## 2020-01-08 NOTE — Telephone Encounter (Signed)
Patient called stating that her job is not allowing her to work from home. Patient states that the letter PCP wrote is missing her Blood pressure reading. Patient states that Ms.Jasmine December need to know if patient has High Blood Pressure per the CDC guidelines. Please contact Jasmine December to get a better understanding on what the letter has to state.   Sharon [employer] [phone]  Romeo Rabon [phone]

## 2020-01-08 NOTE — Telephone Encounter (Signed)
Sent to PCP ?

## 2020-01-13 ENCOUNTER — Institutional Professional Consult (permissible substitution) (INDEPENDENT_AMBULATORY_CARE_PROVIDER_SITE_OTHER): Payer: Self-pay | Admitting: Licensed Clinical Social Worker

## 2020-01-16 ENCOUNTER — Telehealth (INDEPENDENT_AMBULATORY_CARE_PROVIDER_SITE_OTHER): Payer: Self-pay

## 2020-01-16 NOTE — Telephone Encounter (Signed)
Patient called to request phone number to [facility]. States "PCP is great but not as great as she needs her to be". Patient was provide [facility] phone number stated she would contact them to establish care.

## 2020-01-19 ENCOUNTER — Other Ambulatory Visit (INDEPENDENT_AMBULATORY_CARE_PROVIDER_SITE_OTHER): Payer: Self-pay | Admitting: Primary Care

## 2020-01-19 ENCOUNTER — Telehealth (INDEPENDENT_AMBULATORY_CARE_PROVIDER_SITE_OTHER): Payer: Self-pay

## 2020-01-19 MED ORDER — PROPRANOLOL HCL 10 MG PO TABS: 10.0000 mg | ORAL_TABLET | Freq: Three times a day (TID) | ORAL | 0 refills | Status: DC

## 2020-01-19 MED ORDER — METHIMAZOLE 5 MG PO TABS: 5.0000 mg | ORAL_TABLET | Freq: Two times a day (BID) | ORAL | 0 refills | Status: DC

## 2020-01-19 NOTE — Telephone Encounter (Signed)
Sent to PCP ?

## 2020-01-19 NOTE — Telephone Encounter (Signed)
Patient states she felt frustrated on Friday ands would like for michelle to give her a call explaining how she needs to take her medication and also to inform her of the medication.  Please advice [phone]

## 2020-01-19 NOTE — Telephone Encounter (Signed)
Spoke with patient informed she is to be on Tapazole 5 mg twice daily and inderal 10mg  three times a day until seen by endocrinologist

## 2020-01-22 ENCOUNTER — Other Ambulatory Visit: Payer: Self-pay

## 2020-01-22 ENCOUNTER — Encounter: Payer: Self-pay | Admitting: Endocrinology

## 2020-01-22 ENCOUNTER — Ambulatory Visit (INDEPENDENT_AMBULATORY_CARE_PROVIDER_SITE_OTHER): Payer: BC Managed Care – PPO | Admitting: Endocrinology

## 2020-01-22 DIAGNOSIS — E0590 Thyrotoxicosis, unspecified without thyrotoxic crisis or storm: Secondary | ICD-10-CM | POA: Diagnosis not present

## 2020-01-22 MED ORDER — METHIMAZOLE 10 MG PO TABS: 40.0000 mg | ORAL_TABLET | Freq: Two times a day (BID) | ORAL | 1 refills | Status: DC

## 2020-01-22 MED ORDER — PROPRANOLOL HCL 10 MG PO TABS: 10.0000 mg | ORAL_TABLET | Freq: Three times a day (TID) | ORAL | 1 refills | Status: DC

## 2020-01-22 NOTE — Patient Instructions (Addendum)
I have sent 2 prescriptions to your pharmacy: to slow the heart rate, and to slow the thyroid itself. If ever you have fever while taking methimazole, stop it and call us, even if the reason is obvious, because of the risk of a rare side-effect. In view of your medical condition, you should avoid pregnancy until we have decided it is safe.   Please come back for a follow-up appointment in 2-3 weeks.  I hope to be able to clear you to return to work then.

## 2020-01-22 NOTE — Progress Notes (Signed)
Subjective:    Patient ID: Alexandria Frank, female    DOB: May 08, 1991, 29 y.o.   MRN: [id number]  HPI Pt is referred by Gwinda Passe, NP, for hyperthyroidism.  Pt reports she was dx'ed with hyperthyroidism in last month.  she was rx'ed 2 meds, but has not yet started taking.  she has never had XRT to the anterior neck, or thyroid surgery.  she has never had thyroid imaging.  she does not consume kelp or any other non-prescribed thyroid medication.  she has never been on amiodarone.  She has palpitations, heat intolerance, doe, excessive diaphoresis, anxiety, and tremor.  She last worked on 01/20/20.  No past medical history on file.  No past surgical history on file.  Social History   Socioeconomic History  . Marital status: Single    Spouse name: Not on file  . Number of children: Not on file  . Years of education: Not on file  . Highest education level: Not on file  Occupational History  . Not on file  Tobacco Use  . Smoking status: Former Games developer  . Smokeless tobacco: Never Used  Substance and Sexual Activity  . Alcohol use: Yes  . Drug use: No  . Sexual activity: Yes  Other Topics Concern  . Not on file  Social History Narrative  . Not on file   Social Determinants of Health   Financial Resource Strain:   . Difficulty of Paying Living Expenses:   Food Insecurity:   . Worried About Programme researcher, broadcasting/film/video in the Last Year:   . Barista in the Last Year:   Transportation Needs:   . Freight forwarder (Medical):   Marland Kitchen Lack of Transportation (Non-Medical):   Physical Activity:   . Days of Exercise per Week:   . Minutes of Exercise per Session:   Stress:   . Feeling of Stress :   Social Connections:   . Frequency of Communication with Friends and Family:   . Frequency of Social Gatherings with Friends and Family:   . Attends Religious Services:   . Active Member of Clubs or Organizations:   . Attends Banker Meetings:   Marland Kitchen Marital Status:    Intimate Partner Violence:   . Fear of Current or Ex-Partner:   . Emotionally Abused:   Marland Kitchen Physically Abused:   . Sexually Abused:     No current outpatient medications on file prior to visit.   No current facility-administered medications on file prior to visit.    No Known Allergies  Family History  Problem Relation Age of Onset  . Hyperthyroidism Mother     BP 120/62   Pulse (!) 135   Ht 5\' 10"  (1.778 m)   Wt 150 lb (68 kg)   SpO2 98%   BMI 21.52 kg/m    Review of Systems She has muscle weakness.  She has lost 20 lbs x a few months.       Objective:   Physical Exam VS: see vs page GEN: no distress HEAD: head: no deformity eyes: no periorbital swelling, no proptosis external nose and ears are normal NECK: thyroid is 5-10 times normal size, R>L CHEST WALL: no deformity LUNGS: clear to auscultation CV: rtachycardia rate, but reg rhythm, no murmur.  MUSCULOSKELETAL: muscle bulk and strength are grossly normal.  no obvious joint swelling.  gait is normal and steady EXTEMITIES: no deformity.  no leg edema PULSES: no carotid bruit NEURO:  cn 2-12 grossly intact.  readily moves all 4's.  sensation is intact to touch on all 4's.  No tremor. SKIN:  Normal texture and temperature.  No rash or suspicious lesion is visible.  Not diaphoretic NODES:  None palpable at the neck PSYCH: alert, well-oriented.  Does not appear anxious nor depressed.  I have reviewed outside records, and summarized: Pt was noted to have abnormal TFT, and referred here.  Due to tachycardia, she was also rx'ed B-blocker   Lab Results  Component Value Date   TSH <0.005 (L) 12/15/2019       Assessment & Plan:  Hyperthyroidism, new to me.  Severe, but no evidence of storm.  Noncompliance with medication.  She is advised to take.   Patient Instructions  I have sent 2 prescriptions to your pharmacy: to slow the heart rate, and to slow the thyroid itself. If ever you have fever while taking  methimazole, stop it and call us, even if the reason is obvious, because of the risk of a rare side-effect. In view of your medical condition, you should avoid pregnancy until we have decided it is safe.   Please come back for a follow-up appointment in 2-3 weeks.  I hope to be able to clear you to return to work then.

## 2020-01-24 DIAGNOSIS — E0500 Thyrotoxicosis with diffuse goiter without thyrotoxic crisis or storm: Secondary | ICD-10-CM | POA: Insufficient documentation

## 2020-01-24 DIAGNOSIS — E0590 Thyrotoxicosis, unspecified without thyrotoxic crisis or storm: Secondary | ICD-10-CM | POA: Insufficient documentation

## 2020-01-26 ENCOUNTER — Telehealth (INDEPENDENT_AMBULATORY_CARE_PROVIDER_SITE_OTHER): Payer: Self-pay

## 2020-01-26 NOTE — Telephone Encounter (Signed)
Patient called to inform that her job took her out for [facility] and was advice she would be out for a month with no pay. Patient would like a call back from PCP, states that her job needs a letter stating the exact reasons why she needs to be out or exact reason why she needs to work from.  Please advice [phone]

## 2020-01-26 NOTE — Telephone Encounter (Signed)
Sent to PCP ?

## 2020-01-27 ENCOUNTER — Ambulatory Visit: Payer: Self-pay | Admitting: Internal Medicine

## 2020-01-27 NOTE — Telephone Encounter (Signed)
Called Alexandria Frank unable to leave a message mail box is not set up. Patient called while writing response .FMLA is determine by individual company policy . I can not do anything about salary and payment. Patient will be in for forms to be sign to return to work

## 2020-02-09 ENCOUNTER — Telehealth (INDEPENDENT_AMBULATORY_CARE_PROVIDER_SITE_OTHER): Payer: Self-pay

## 2020-02-09 NOTE — Telephone Encounter (Signed)
Patient would like for PCP to give her a call back in regards to her FMLA forms in order for her to go back to work.   Please advice [phone]

## 2020-02-10 NOTE — Telephone Encounter (Signed)
Tried to call patient she never seen endocrinologist unable to reach her. Now she want to go back to work??? Paper work ready

## 2020-02-26 ENCOUNTER — Other Ambulatory Visit: Payer: Self-pay

## 2020-02-26 ENCOUNTER — Ambulatory Visit: Payer: BC Managed Care – PPO | Attending: Internal Medicine | Admitting: Family

## 2020-02-26 DIAGNOSIS — Z7689 Persons encountering health services in other specified circumstances: Secondary | ICD-10-CM

## 2020-02-26 NOTE — Patient Instructions (Signed)
Keep appointment with endocrinology. Schedule physical examination and labs with primary physician in 2 months. Hyperthyroidism  Hyperthyroidism is when the thyroid gland is too active (overactive). The thyroid gland is a small gland located in the lower front part of the neck, just in front of the windpipe (trachea). This gland makes hormones that help control how the body uses food for energy (metabolism) as well as how the heart and brain function. These hormones also play a role in keeping your bones strong. When the thyroid is overactive, it produces too much of a hormone called thyroxine. What are the causes? This condition may be caused by:  Graves' disease. This is a disorder in which the body's disease-fighting system (immune system) attacks the thyroid gland. This is the most common cause.  Inflammation of the thyroid gland.  A tumor in the thyroid gland.  Use of certain medicines, including: ? Prescription thyroid hormone replacement. ? Herbal supplements that mimic thyroid hormones. ? Amiodarone therapy.  Solid or fluid-filled lumps within your thyroid gland (thyroid nodules).  Taking in a large amount of iodine from foods or medicines. What increases the risk? You are more likely to develop this condition if:  You are female.  You have a family history of thyroid conditions.  You smoke tobacco.  You use a medicine called lithium.  You take medicines that affect the immune system (immunosuppressants). What are the signs or symptoms? Symptoms of this condition include:  Nervousness.  Inability to tolerate heat.  Unexplained weight loss.  Diarrhea.  Change in the texture of hair or skin.  Heart skipping beats or making extra beats.  Rapid heart rate.  Loss of menstruation.  Shaky hands.  Fatigue.  Restlessness.  Sleep problems.  Enlarged thyroid gland or a lump in the thyroid (nodule). You may also have symptoms of Graves' disease, which may  include:  Protruding eyes.  Dry eyes.  Red or swollen eyes.  Problems with vision. How is this diagnosed? This condition may be diagnosed based on:  Your symptoms and medical history.  A physical exam.  Blood tests.  Thyroid ultrasound. This test involves using sound waves to produce images of the thyroid gland.  A thyroid scan. A radioactive substance is injected into a vein, and images show how much iodine is present in the thyroid.  Radioactive iodine uptake test (RAIU). A small amount of radioactive iodine is given by mouth to see how much iodine the thyroid absorbs after a certain amount of time. How is this treated? Treatment depends on the cause and severity of the condition. Treatment may include:  Medicines to reduce the amount of thyroid hormone your body makes.  Radioactive iodine treatment (radioiodine therapy). This involves swallowing a small dose of radioactive iodine, in capsule or liquid form, to kill thyroid cells.  Surgery to remove part or all of your thyroid gland. You may need to take thyroid hormone replacement medicine for the rest of your life after thyroid surgery.  Medicines to help manage your symptoms. Follow these instructions at home:   Take over-the-counter and prescription medicines only as told by your health care provider.  Do not use any products that contain nicotine or tobacco, such as cigarettes and e-cigarettes. If you need help quitting, ask your health care provider.  Follow any instructions from your health care provider about diet. You may be instructed to limit foods that contain iodine.  Keep all follow-up visits as told by your health care provider. This is important. ?  You will need to have blood tests regularly so that your health care provider can monitor your condition. Contact a health care provider if:  Your symptoms do not get better with treatment.  You have a fever.  You are taking thyroid hormone replacement  medicine and you: ? Have symptoms of depression. ? Feel like you are tired all the time. ? Gain weight. Get help right away if:  You have chest pain.  You have decreased alertness or a change in your awareness.  You have abdominal pain.  You feel dizzy.  You have a rapid heartbeat.  You have an irregular heartbeat.  You have difficulty breathing. Summary  The thyroid gland is a small gland located in the lower front part of the neck, just in front of the windpipe (trachea).  Hyperthyroidism is when the thyroid gland is too active (overactive) and produces too much of a hormone called thyroxine.  The most common cause is Graves' disease, a disorder in which your immune system attacks the thyroid gland.  Hyperthyroidism can cause various symptoms, such as unexplained weight loss, nervousness, inability to tolerate heat, or changes in your heartbeat.  Treatment may include medicine to reduce the amount of thyroid hormone your body makes, radioiodine therapy, surgery, or medicines to manage symptoms. This information is not intended to replace advice given to you by your health care provider. Make sure you discuss any questions you have with your health care provider. Document Revised: 08/31/2017 Document Reviewed: 08/29/2017 Elsevier Patient Education  2020 [employer].

## 2020-02-26 NOTE — Progress Notes (Signed)
Virtual Visit via Telephone Note  I connected with Alexandria Frank, on 02/26/2020 at 10:07 AM by telephone due to the COVID-19 pandemic and verified that I am speaking with the correct person using two identifiers.  Due to current restrictions/limitations of in-office visits due to the COVID-19 pandemic, this scheduled clinical appointment was converted to a telehealth visit.   Consent: I discussed the limitations, risks, security and privacy concerns of performing an evaluation and management service by telephone and the availability of in person appointments. I also discussed with the patient that there may be a patient responsible charge related to this service. The patient expressed understanding and agreed to proceed.  Location of Patient: Home  Location of Provider: [place]   Persons participating in Telemedicine visit: Lonnetta Kniskern Alexandria Frank Minette Brine, NP Orlan Leavens, CMA  History of Present Illness: Alexandria Frank is a 29 year old female with history of hyperthyroidism who presents to re-establish care with a new provider.   PRESENT ILLNESS:  Patient has hyperthyroidism. Reports she has not been taking prescribed Methimazole and Propranolol on a consistent basis. Reports she prefers to get thyroid surgery done. Reports since then she has reconsidered and understands the importance of taking the medication and has been making an effort to take as prescribed. Reports she still feels like her heart rate is high even with taking the medication because she can feel her heart racing sometimes. Last visit 01/22/2020 at [facility] with Dr. Loanne Drilling. During that encounter Methimazole and Propranolol refilled, patient was not in thyroid storm, patient was taken out of work related to increased heart rate, and encouraged to follow-up in 2 to 3 weeks. Reports she plans to follow-up with Endocrinology soon.  PAST HISTORY:  No pertinent past medical history.  No  pertinent surgical history. No pertinent obstetric/gynecologic history. Last PAP smear 07/30/2019, negative for malignancy  FAMILY HISTORY:  Mother and maternal grandmother, hyperthyroidism.  PERSONAL AND SOCIAL HISTORY: Occupation: [employer] Last year of schooling: High school  Home situation: live in the home with significant other Sources of stress: Yes, uses spiritual methods to cope. Denies suicidal ideation and thoughts of self-harm. Leisure activities: hang out with friends, outdoor activities, restaurants, skating, hiking  Exercise: No Diet: restaurant food a lot  Smoking status: former smoker  Alcohol use: Yes Illicit substance use: No Sexual activity: Yes  No past medical history on file. No Known Allergies  Current Outpatient Medications on File Prior to Visit  Medication Sig Dispense Refill  . methimazole (TAPAZOLE) 10 MG tablet Take 4 tablets (40 mg total) by mouth 2 (two) times daily. 240 tablet 1  . propranolol (INDERAL) 10 MG tablet Take 1 tablet (10 mg total) by mouth 3 (three) times daily. 90 tablet 1   No current facility-administered medications on file prior to visit.    Observations/Objective: Alert and oriented x 3. Not in acute distress. Physical examination not completed as this is a telemedicine visit.  Assessment and Plan: 1. Encounter to establish care: -Patient presents to establish care.  -Endocrinology following patient for hyperthyroidism. Taking Methimazole and Propranolol for management. Last visit 01/22/2020. Reports she plans to follow-up soon. -Encouraged patient to make an appointment for physical examination and labs within 2 months. Patient agreeable.   Follow Up Instructions: Patient was given clear instructions to go to Emergency Department or return to medical center if symptoms don't improve, worsen, or new problems develop.The patient verbalized understanding.  I discussed the assessment and treatment plan with the patient. The  patient was provided an opportunity to ask questions and all were answered. The patient agreed with the plan and demonstrated an understanding of the instructions.   The patient was advised to call back or seek an in-person evaluation if the symptoms worsen or if the condition fails to improve as anticipated.   I provided 10 bminutes total of non-face-to-face time during this encounter including median intraservice time, reviewing previous notes, labs, imaging, medications, management and patient verbalized understanding.    Rema Fendt, NP  [facility] and [facility] [address] [place], [place] [phone]   02/26/2020, 10:07 AM

## 2020-03-07 DIAGNOSIS — Z20822 Contact with and (suspected) exposure to covid-19: Secondary | ICD-10-CM | POA: Diagnosis not present

## 2020-03-30 ENCOUNTER — Ambulatory Visit (INDEPENDENT_AMBULATORY_CARE_PROVIDER_SITE_OTHER): Payer: BC Managed Care – PPO | Admitting: Primary Care

## 2020-04-01 ENCOUNTER — Ambulatory Visit: Payer: BC Managed Care – PPO | Admitting: Endocrinology

## 2020-04-06 ENCOUNTER — Ambulatory Visit: Payer: BC Managed Care – PPO | Admitting: Endocrinology

## 2020-04-14 DIAGNOSIS — D649 Anemia, unspecified: Secondary | ICD-10-CM | POA: Insufficient documentation

## 2020-04-14 DIAGNOSIS — Z6821 Body mass index (BMI) 21.0-21.9, adult: Secondary | ICD-10-CM | POA: Diagnosis not present

## 2020-04-14 DIAGNOSIS — E0590 Thyrotoxicosis, unspecified without thyrotoxic crisis or storm: Secondary | ICD-10-CM | POA: Insufficient documentation

## 2020-04-14 DIAGNOSIS — N760 Acute vaginitis: Secondary | ICD-10-CM | POA: Diagnosis not present

## 2020-04-14 DIAGNOSIS — Z01419 Encounter for gynecological examination (general) (routine) without abnormal findings: Secondary | ICD-10-CM | POA: Diagnosis not present

## 2020-04-19 ENCOUNTER — Other Ambulatory Visit: Payer: Self-pay

## 2020-04-19 ENCOUNTER — Ambulatory Visit (INDEPENDENT_AMBULATORY_CARE_PROVIDER_SITE_OTHER): Payer: BC Managed Care – PPO | Admitting: Endocrinology

## 2020-04-19 VITALS — BP 150/86 | HR 130 | Ht 70.0 in | Wt 151.6 lb

## 2020-04-19 DIAGNOSIS — E0590 Thyrotoxicosis, unspecified without thyrotoxic crisis or storm: Secondary | ICD-10-CM | POA: Diagnosis not present

## 2020-04-19 NOTE — Progress Notes (Signed)
Subjective:    Patient ID: Alexandria Frank, female    DOB: 1991-04-11, 29 y.o.   MRN: [id number]  HPI Pt returns for f/u of hyperthyroidism (dx'ed 2021; tapazole and inderal were rx'ed, due to severity; she has never had thyroid imaging; therapy has been limited by noncompliance).  Pt reports palpitations and heat intolerance.  Pt says she sometimes misses meds.   No past medical history on file.  No past surgical history on file.  Social History   Socioeconomic History  . Marital status: Single    Spouse name: Not on file  . Number of children: Not on file  . Years of education: Not on file  . Highest education level: Not on file  Occupational History  . Not on file  Tobacco Use  . Smoking status: Former Games developer  . Smokeless tobacco: Never Used  Substance and Sexual Activity  . Alcohol use: Yes  . Drug use: No  . Sexual activity: Yes  Other Topics Concern  . Not on file  Social History Narrative  . Not on file   Social Determinants of Health   Financial Resource Strain:   . Difficulty of Paying Living Expenses:   Food Insecurity:   . Worried About Programme researcher, broadcasting/film/video in the Last Year:   . Barista in the Last Year:   Transportation Needs:   . Freight forwarder (Medical):   Marland Kitchen Lack of Transportation (Non-Medical):   Physical Activity:   . Days of Exercise per Week:   . Minutes of Exercise per Session:   Stress:   . Feeling of Stress :   Social Connections:   . Frequency of Communication with Friends and Family:   . Frequency of Social Gatherings with Friends and Family:   . Attends Religious Services:   . Active Member of Clubs or Organizations:   . Attends Banker Meetings:   Marland Kitchen Marital Status:   Intimate Partner Violence:   . Fear of Current or Ex-Partner:   . Emotionally Abused:   Marland Kitchen Physically Abused:   . Sexually Abused:     Current Outpatient Medications on File Prior to Visit  Medication Sig Dispense Refill  . propranolol  (INDERAL) 10 MG tablet Take 1 tablet (10 mg total) by mouth 3 (three) times daily. 90 tablet 1   No current facility-administered medications on file prior to visit.    No Known Allergies  Family History  Problem Relation Age of Onset  . Hyperthyroidism Mother     BP (!) 150/86 (BP Location: Left Arm, Patient Position: Sitting)   Pulse (!) 130   Ht 5\' 10"  (1.778 m)   Wt 151 lb 9.6 oz (68.8 kg)   SpO2 98%   BMI 21.75 kg/m    Review of Systems Denies fever    Objective:   Physical Exam VITAL SIGNS:  See vs page GENERAL: no distress NECK: thyroid is 10 times normal size, diffuse NEURO: slight tremor of the hands.       Assessment & Plan:  Hyperthyroidism, persistent HTN: for now, pt needs inderal for this as well as the above Noncompliance with medication.  We discussed other rx options.  She chooses RAI rx.   Patient Instructions  Blood tests are requested for you today.  We'll let you know about the results.  Please stop taking the methimazole, and continue the same propranolol In view of your medical condition, you should avoid pregnancy until we have decided it is  safe.   You should take the radioactive iodine treatment pill.   let's check a thyroid "scan" (a special, but easy and painless type of thyroid x ray).  It works like this: you go to the x-ray department of the hospital to swallow a pill, which contains a miniscule amount of radiation.  You will not notice any symptoms from this.  You will go back to the x-ray department the next day, to lie down in front of a camera.  The results of this will be sent to me.   Based on the results, I hope to order for you a treatment pill of radioactive iodine.  Although it is a larger amount of radiation, you will again notice no symptoms from this.  The pill is gone from your body in a few days (during which you should stay away from other people), but takes several months to work.  Therefore, please return here approximately  6-8 weeks after the treatment.  This treatment has been available for many years, and the only known side-effect is an underactive thyroid.  It is possible that i would eventually prescribe for you a thyroid hormone pill, which is very inexpensive.  You don't have to worry about side-effects of this thyroid hormone pill, because it is the same molecule your thyroid makes. Please come back for a follow-up appointment in 6 weeks.

## 2020-04-19 NOTE — Patient Instructions (Addendum)
Blood tests are requested for you today.  We'll let you know about the results.  Please stop taking the methimazole, and continue the same propranolol In view of your medical condition, you should avoid pregnancy until we have decided it is safe.   You should take the radioactive iodine treatment pill.   let's check a thyroid "scan" (a special, but easy and painless type of thyroid x ray).  It works like this: you go to the x-ray department of the hospital to swallow a pill, which contains a miniscule amount of radiation.  You will not notice any symptoms from this.  You will go back to the x-ray department the next day, to lie down in front of a camera.  The results of this will be sent to me.   Based on the results, I hope to order for you a treatment pill of radioactive iodine.  Although it is a larger amount of radiation, you will again notice no symptoms from this.  The pill is gone from your body in a few days (during which you should stay away from other people), but takes several months to work.  Therefore, please return here approximately 6-8 weeks after the treatment.  This treatment has been available for many years, and the only known side-effect is an underactive thyroid.  It is possible that i would eventually prescribe for you a thyroid hormone pill, which is very inexpensive.  You don't have to worry about side-effects of this thyroid hormone pill, because it is the same molecule your thyroid makes. Please come back for a follow-up appointment in 6 weeks.

## 2020-04-20 LAB — T4, FREE: Free T4: 3.29 ng/dL — ABNORMAL HIGH (ref 0.60–1.60)

## 2020-04-20 LAB — TSH: TSH: <0.01 u[IU]/mL — ABNORMAL LOW (ref 0.35–4.50)

## 2020-04-27 ENCOUNTER — Telehealth: Payer: Self-pay | Admitting: Endocrinology

## 2020-04-27 NOTE — Telephone Encounter (Signed)
Called pt to clarify message. After reviewing pt record, Dr. Everardo All DID NOT advise her to stop the Propranolol but DID advise her to stop the Methimazole. Asked her to confirm what medications she stopped and what she is taking. Pt confirmed she stopped the Propranolol and continued taking the Methimazole. Advised pt that she had in fact confused the verbal and written instructions provided by Dr. Everardo All. Advised she RESUME Propranolol and STOP Methimazole. Asked her to repeat these instructions back to me to ensure she was not confused. Pt successfully repeated my and Dr. George Hugh instructions.  Further asked her to clarify her concern about the RAI pill. States she did not have any questions about the treatment itself but was concerned that she has not yet been scheduled. Advised pt about the insurance authorization and scheduling process. Asked that she remain patient until an insurance auth had been received allowing [place] to proceed with scheduling. Reassured I will forward this message to Referral Coordinator so that she is aware of this conversation as well as to call with follow up to her scheduling. Verbalized acceptance and understanding.  Message being routed to Knute Neu, CMA and to Dr. Everardo All so that he is aware of the patient's confusion.

## 2020-04-27 NOTE — Telephone Encounter (Signed)
Patient called asking to speak to nurse about her radioactive pill - says she has not gotten a call about that. Also she was wondering why Dr Everardo All had her stop taking her heart medication because her heart rate has been abnormal. Patient ph# [phone] or [phone]

## 2020-04-29 NOTE — Telephone Encounter (Signed)
Just an FYI patient is scheduled and informed

## 2020-05-12 ENCOUNTER — Other Ambulatory Visit: Payer: Self-pay

## 2020-05-12 ENCOUNTER — Encounter (HOSPITAL_COMMUNITY)
Admission: RE | Admit: 2020-05-12 | Discharge: 2020-05-12 | Disposition: A | Discharge status: Home / Self Care | Payer: BC Managed Care – PPO | Source: Ambulatory Visit | Attending: Endocrinology | Admitting: Endocrinology

## 2020-05-12 DIAGNOSIS — E0590 Thyrotoxicosis, unspecified without thyrotoxic crisis or storm: Secondary | ICD-10-CM | POA: Insufficient documentation

## 2020-05-12 MED ORDER — SODIUM IODIDE I-123 7.4 MBQ CAPS
441.9000 | ORAL_CAPSULE | Freq: Once | ORAL | Status: AC
  Administered 2020-05-12: 441.9 via ORAL

## 2020-05-13 ENCOUNTER — Encounter (HOSPITAL_COMMUNITY)
Admission: RE | Admit: 2020-05-13 | Discharge: 2020-05-13 | Disposition: A | Discharge status: Home / Self Care | Payer: BC Managed Care – PPO | Source: Ambulatory Visit | Attending: Endocrinology | Admitting: Endocrinology

## 2020-05-26 ENCOUNTER — Encounter: Payer: Self-pay | Admitting: Endocrinology

## 2020-05-26 ENCOUNTER — Telehealth: Payer: Self-pay

## 2020-05-26 ENCOUNTER — Other Ambulatory Visit: Payer: Self-pay | Admitting: Endocrinology

## 2020-05-26 DIAGNOSIS — E0590 Thyrotoxicosis, unspecified without thyrotoxic crisis or storm: Secondary | ICD-10-CM

## 2020-05-26 NOTE — Telephone Encounter (Signed)
Received call from [place] with [facility]. States the following:  Alexandria Frank, RT on 05/26/2020 1:34 PM  EOV  441.9UCI I-123 SODIUM IODIDE ORAL/AWB  HYPERTHYROIDISM  0.01 ON 04/19/20  There was a question about accuracy of uptake results. We contacted the patient to have her come back today to repeat her uptake and patient was a no show   Glitch when getting counts   - Further states pt was to be seen today to complete the exam but she was a NS. Asked for assistance with re-scheduling appt. Also requested another for THYROID UPTAKE ONLY.  - Immediately called pt. States she is available for [facility] to call to reschedule. Advised I will be calling [facility] to make them aware that she is available for them to call to reschedule appt. Pt is also requesting time off from work since she is not feeling well. Advised she send her request via My Chart for Dr. Everardo All to review and address. Verbalized acceptance and understanding.  - Spoke with Ladona Ridgel with [facility]. Advised pt is available for her to call to reschedule. States she will not be able to call until the order is placed. Routing this message to Dr. Everardo All to make him aware of expedited need.

## 2020-05-26 NOTE — Telephone Encounter (Signed)
[facility] THYROID UPT ONLY (SINGLE) (Order [id number]) Imaging Date: 05/26/2020 Department: [facility] Ordering/Authorizing: Romero Belling, MD

## 2020-05-26 NOTE — Addendum Note (Signed)
Addended by: Romero Belling on: 05/26/2020 02:08 PM   Modules accepted: Orders

## 2020-05-26 NOTE — Telephone Encounter (Signed)
done

## 2020-06-01 ENCOUNTER — Ambulatory Visit: Payer: BC Managed Care – PPO | Admitting: Endocrinology

## 2020-06-02 ENCOUNTER — Other Ambulatory Visit: Payer: Self-pay

## 2020-06-02 ENCOUNTER — Ambulatory Visit (HOSPITAL_COMMUNITY)
Admission: RE | Admit: 2020-06-02 | Discharge: 2020-06-02 | Disposition: A | Discharge status: Home / Self Care | Payer: BC Managed Care – PPO | Source: Ambulatory Visit | Attending: Endocrinology | Admitting: Endocrinology

## 2020-06-02 DIAGNOSIS — E0590 Thyrotoxicosis, unspecified without thyrotoxic crisis or storm: Secondary | ICD-10-CM

## 2020-06-02 MED ORDER — SODIUM IODIDE I-123 7.4 MBQ CAPS
148.2000 | ORAL_CAPSULE | Freq: Once | ORAL | Status: AC
  Administered 2020-06-02: 148.2 via ORAL

## 2020-06-03 ENCOUNTER — Other Ambulatory Visit: Payer: Self-pay | Admitting: Endocrinology

## 2020-06-03 ENCOUNTER — Encounter (HOSPITAL_COMMUNITY)
Admission: RE | Admit: 2020-06-03 | Discharge: 2020-06-03 | Disposition: A | Discharge status: Home / Self Care | Payer: BC Managed Care – PPO | Source: Ambulatory Visit | Attending: Endocrinology | Admitting: Endocrinology

## 2020-06-03 DIAGNOSIS — E0590 Thyrotoxicosis, unspecified without thyrotoxic crisis or storm: Secondary | ICD-10-CM | POA: Diagnosis not present

## 2020-06-08 ENCOUNTER — Encounter: Payer: Self-pay | Admitting: Endocrinology

## 2020-06-08 ENCOUNTER — Ambulatory Visit (INDEPENDENT_AMBULATORY_CARE_PROVIDER_SITE_OTHER): Payer: BC Managed Care – PPO | Admitting: Endocrinology

## 2020-06-08 ENCOUNTER — Other Ambulatory Visit: Payer: Self-pay

## 2020-06-08 VITALS — BP 118/70 | HR 110 | Ht 70.0 in | Wt 140.4 lb

## 2020-06-08 DIAGNOSIS — E0590 Thyrotoxicosis, unspecified without thyrotoxic crisis or storm: Secondary | ICD-10-CM

## 2020-06-08 NOTE — Progress Notes (Signed)
Subjective:    Patient ID: Alexandria Frank, female    DOB: Jun 18, 1991, 29 y.o.   MRN: [id number]  HPI Pt returns for f/u of hyperthyroidism (dx'ed 2021; tapazole and inderal were rx'ed, due to severity; she has never had thyroid imaging; therapy has been limited by noncompliance, so plan is for RAI rx).  Pt reports heat intolerance.  Pt says she sometimes misses meds.  She last worked 05/28/20.  She is sched to return 06/16/20.   History reviewed. No pertinent past medical history.  History reviewed. No pertinent surgical history.  Social History   Socioeconomic History  . Marital status: Single    Spouse name: Not on file  . Number of children: Not on file  . Years of education: Not on file  . Highest education level: Not on file  Occupational History  . Not on file  Tobacco Use  . Smoking status: Former Games developer  . Smokeless tobacco: Never Used  Substance and Sexual Activity  . Alcohol use: Yes  . Drug use: No  . Sexual activity: Yes  Other Topics Concern  . Not on file  Social History Narrative  . Not on file   Social Determinants of Health   Financial Resource Strain:   . Difficulty of Paying Living Expenses: Not on file  Food Insecurity:   . Worried About Programme researcher, broadcasting/film/video in the Last Year: Not on file  . Ran Out of Food in the Last Year: Not on file  Transportation Needs:   . Lack of Transportation (Medical): Not on file  . Lack of Transportation (Non-Medical): Not on file  Physical Activity:   . Days of Exercise per Week: Not on file  . Minutes of Exercise per Session: Not on file  Stress:   . Feeling of Stress : Not on file  Social Connections:   . Frequency of Communication with Friends and Family: Not on file  . Frequency of Social Gatherings with Friends and Family: Not on file  . Attends Religious Services: Not on file  . Active Member of Clubs or Organizations: Not on file  . Attends Banker Meetings: Not on file  . Marital Status: Not on  file  Intimate Partner Violence:   . Fear of Current or Ex-Partner: Not on file  . Emotionally Abused: Not on file  . Physically Abused: Not on file  . Sexually Abused: Not on file    Current Outpatient Medications on File Prior to Visit  Medication Sig Dispense Refill  . propranolol (INDERAL) 10 MG tablet Take 1 tablet (10 mg total) by mouth 3 (three) times daily. 90 tablet 1   No current facility-administered medications on file prior to visit.    No Known Allergies  Family History  Problem Relation Age of Onset  . Hyperthyroidism Mother     BP 118/70   Pulse (!) 110   Ht 5\' 10"  (1.778 m)   Wt 140 lb 6.4 oz (63.7 kg)   LMP 06/02/2020   SpO2 96%   BMI 20.15 kg/m    Review of Systems Denies fever.      Objective:   Physical Exam VITAL SIGNS:  See vs page.   GENERAL: no distress.  NECK: thyroid is 10 times normal size, R>L.   NEURO: slight tremor of the hands.       Assessment & Plan:  Hyperthyroidism, uncontrolled.  We discussed rx options.  She chooses to go ahead with RAI.   Patient Instructions  Blood tests are requested for you today.  We'll let you know about the results.  Please stay off the methimazole, and continue the same propranolol.   In view of your medical condition, you should avoid pregnancy until we have decided it is safe.   You should take the radioactive iodine treatment pill.   you will receive a phone call, about a day and time for an appointment. Although it is a larger amount of radiation, you will again notice no symptoms from this.  The pill is gone from your body in a few days (during which you should stay away from other people), but takes several months to work.  Therefore, please return here approximately 6-8 weeks after the treatment.  This treatment has been available for many years, and the only known side-effect is an underactive thyroid.  It is possible that i would eventually prescribe for you a thyroid hormone pill, which is very  inexpensive.  You don't have to worry about side-effects of this thyroid hormone pill, because it is the same molecule your thyroid makes.   Please come back for a follow-up appointment in 1 month.

## 2020-06-08 NOTE — Patient Instructions (Addendum)
Blood tests are requested for you today.  We'll let you know about the results.  Please stay off the methimazole, and continue the same propranolol.   In view of your medical condition, you should avoid pregnancy until we have decided it is safe.   You should take the radioactive iodine treatment pill.   you will receive a phone call, about a day and time for an appointment. Although it is a larger amount of radiation, you will again notice no symptoms from this.  The pill is gone from your body in a few days (during which you should stay away from other people), but takes several months to work.  Therefore, please return here approximately 6-8 weeks after the treatment.  This treatment has been available for many years, and the only known side-effect is an underactive thyroid.  It is possible that i would eventually prescribe for you a thyroid hormone pill, which is very inexpensive.  You don't have to worry about side-effects of this thyroid hormone pill, because it is the same molecule your thyroid makes.   Please come back for a follow-up appointment in 1 month.

## 2020-06-23 ENCOUNTER — Ambulatory Visit (HOSPITAL_COMMUNITY): Admission: RE | Admit: 2020-06-23 | Payer: BC Managed Care – PPO | Source: Ambulatory Visit

## 2020-06-24 ENCOUNTER — Ambulatory Visit (HOSPITAL_COMMUNITY)
Admission: RE | Admit: 2020-06-24 | Discharge: 2020-06-24 | Disposition: A | Discharge status: Home / Self Care | Payer: BC Managed Care – PPO | Source: Ambulatory Visit | Attending: Endocrinology | Admitting: Endocrinology

## 2020-06-24 ENCOUNTER — Other Ambulatory Visit: Payer: Self-pay

## 2020-06-24 DIAGNOSIS — E0590 Thyrotoxicosis, unspecified without thyrotoxic crisis or storm: Secondary | ICD-10-CM | POA: Insufficient documentation

## 2020-06-24 LAB — PREGNANCY, URINE: Preg Test, Ur: NEGATIVE

## 2020-06-24 MED ORDER — SODIUM IODIDE I 131 CAPSULE
12.4200 | Freq: Once | INTRAVENOUS | Status: AC | PRN
  Administered 2020-06-24: 12.42 via ORAL

## 2020-07-12 NOTE — Telephone Encounter (Signed)
Patient came by and dropped off FMLA paperwork - placed in [facility] box.

## 2020-07-14 DIAGNOSIS — Z0279 Encounter for issue of other medical certificate: Secondary | ICD-10-CM

## 2020-07-21 ENCOUNTER — Other Ambulatory Visit: Payer: Self-pay

## 2020-07-21 ENCOUNTER — Encounter: Payer: Self-pay | Admitting: Endocrinology

## 2020-07-21 ENCOUNTER — Ambulatory Visit (INDEPENDENT_AMBULATORY_CARE_PROVIDER_SITE_OTHER): Payer: BC Managed Care – PPO | Admitting: Endocrinology

## 2020-07-21 VITALS — BP 120/80 | HR 102 | Ht 70.0 in | Wt 147.0 lb

## 2020-07-21 DIAGNOSIS — E0590 Thyrotoxicosis, unspecified without thyrotoxic crisis or storm: Secondary | ICD-10-CM | POA: Diagnosis not present

## 2020-07-21 NOTE — Progress Notes (Signed)
   Subjective:    Patient ID: Alexandria Frank, female    DOB: 02-27-1991, 29 y.o.   MRN: [id number]  HPI Pt returns for f/u of hyperthyroidism (dx'ed 2021; tapazole and inderal were rx'ed, due to severity; she has never had thyroid imaging; therapy has been limited by noncompliance, so plan is for RAI rx).  pt states she feels well in general.   No past medical history on file.  No past surgical history on file.  Social History   Socioeconomic History  . Marital status: Single    Spouse name: Not on file  . Number of children: Not on file  . Years of education: Not on file  . Highest education level: Not on file  Occupational History  . Not on file  Tobacco Use  . Smoking status: Former Games developer  . Smokeless tobacco: Never Used  Substance and Sexual Activity  . Alcohol use: Yes  . Drug use: No  . Sexual activity: Yes  Other Topics Concern  . Not on file  Social History Narrative  . Not on file   Social Determinants of Health   Financial Resource Strain:   . Difficulty of Paying Living Expenses: Not on file  Food Insecurity:   . Worried About Programme researcher, broadcasting/film/video in the Last Year: Not on file  . Ran Out of Food in the Last Year: Not on file  Transportation Needs:   . Lack of Transportation (Medical): Not on file  . Lack of Transportation (Non-Medical): Not on file  Physical Activity:   . Days of Exercise per Week: Not on file  . Minutes of Exercise per Session: Not on file  Stress:   . Feeling of Stress : Not on file  Social Connections:   . Frequency of Communication with Friends and Family: Not on file  . Frequency of Social Gatherings with Friends and Family: Not on file  . Attends Religious Services: Not on file  . Active Member of Clubs or Organizations: Not on file  . Attends Banker Meetings: Not on file  . Marital Status: Not on file  Intimate Partner Violence:   . Fear of Current or Ex-Partner: Not on file  . Emotionally Abused: Not on file  .  Physically Abused: Not on file  . Sexually Abused: Not on file    Current Outpatient Medications on File Prior to Visit  Medication Sig Dispense Refill  . propranolol (INDERAL) 10 MG tablet Take 1 tablet (10 mg total) by mouth 3 (three) times daily. 90 tablet 1   No current facility-administered medications on file prior to visit.    No Known Allergies  Family History  Problem Relation Age of Onset  . Hyperthyroidism Mother     BP 120/80   Pulse (!) 102   Ht 5\' 10"  (1.778 m)   Wt 147 lb (66.7 kg)   SpO2 99%   BMI 21.09 kg/m    Review of Systems Denies fever.      Objective:   Physical Exam VITAL SIGNS:  See vs page.   GENERAL: no distress.  NECK: thyroid is 5 times normal size, R>L.   Lab Results  Component Value Date   TSH <0.01 (L) 04/19/2020      Assessment & Plan:  Hyperthyroidism: clinically not better yet.  Please continue the same Inderal.

## 2020-07-21 NOTE — Patient Instructions (Addendum)
Blood tests are requested for you today.  We'll let you know about the results.  Please continue the same propanolol.   Please come back for a follow-up appointment in 4-6 weeks.

## 2020-07-22 ENCOUNTER — Other Ambulatory Visit: Payer: BC Managed Care – PPO

## 2020-07-28 ENCOUNTER — Other Ambulatory Visit: Payer: Self-pay

## 2020-07-28 ENCOUNTER — Ambulatory Visit: Payer: BC Managed Care – PPO | Attending: Nurse Practitioner | Admitting: Nurse Practitioner

## 2020-07-28 ENCOUNTER — Encounter: Payer: Self-pay | Admitting: Nurse Practitioner

## 2020-07-28 VITALS — Ht 70.0 in | Wt 145.0 lb

## 2020-07-28 DIAGNOSIS — Z7689 Persons encountering health services in other specified circumstances: Secondary | ICD-10-CM

## 2020-07-28 DIAGNOSIS — F419 Anxiety disorder, unspecified: Secondary | ICD-10-CM | POA: Diagnosis not present

## 2020-07-28 DIAGNOSIS — E0590 Thyrotoxicosis, unspecified without thyrotoxic crisis or storm: Secondary | ICD-10-CM | POA: Diagnosis not present

## 2020-07-28 MED ORDER — HYDROXYZINE HCL 25 MG PO TABS: ORAL_TABLET | ORAL | 2 refills | Status: DC

## 2020-07-28 NOTE — Progress Notes (Signed)
Virtual Visit via Telephone Note Due to national recommendations of social distancing due to COVID 19, telehealth visit is felt to be most appropriate for this patient at this time.  I discussed the limitations, risks, security and privacy concerns of performing an evaluation and management service by telephone and the availability of in person appointments. I also discussed with the patient that there may be a patient responsible charge related to this service. The patient expressed understanding and agreed to proceed.    I connected with Alexandria Frank on 07/28/20  at   1:50 PM EDT  EDT by telephone and verified that I am speaking with the correct person using two identifiers.   Consent I discussed the limitations, risks, security and privacy concerns of performing an evaluation and management service by telephone and the availability of in person appointments. I also discussed with the patient that there may be a patient responsible charge related to this service. The patient expressed understanding and agreed to proceed.   Location of Patient: Private Residence   Location of Provider: [facility]    Persons participating in Telemedicine visit: Alexandria Frank CMA Alexandria Frank    History of Present Illness: Telemedicine visit for: Establish Care  has a past medical history of Anxiety and Thyroid disease.  Hyperthyroidism Seeing Dr. Everardo All for hyperthyroidism. She is s/p  RAI 06-2020. Currently not taking any medications for thyroid disorder. She has a follow up scheduled with Endo in a few weeks.   Tachycardia History of tachycardia. Taking propranolol intermittently. Needs EKG.   Anxiety "Feels like I've always had it forever." Worsening over the past few years. She had a previous visit with another provider and states she was instructed that a medication for her anxiety would be sent in however it was not. She was not pleased with the  care there and is now establishing here. Does not desire a medication that she has to take every day. Will prescribe prn anxiolytic instead of SSRI.  Depression screen [facility] 2/9 07/28/2020 12/15/2019 07/30/2019  Decreased Interest 0 1 0  Down, Depressed, Hopeless 1 1 0  PHQ - 2 Score 1 2 0  Altered sleeping 0 2 -  Tired, decreased energy 1 2 -  Change in appetite 1 2 -  Feeling bad or failure about yourself  1 0 -  Trouble concentrating 0 0 -  Moving slowly or fidgety/restless 0 0 -  Suicidal thoughts 0 0 -  PHQ-9 Score 4 8 -  Difficult doing work/chores - Somewhat difficult -    GAD 7 : Generalized Anxiety Score 07/28/2020 12/15/2019 07/30/2019  Nervous, Anxious, on Edge 3 3 0  Control/stop worrying 3 1 0  Worry too much - different things 3 3 1   Trouble relaxing 3 2 0  Restless 0 0 0  Easily annoyed or irritable 3 3 0  Afraid - awful might happen 0 3 0  Total GAD 7 Score 15 15 1   Anxiety Difficulty - Somewhat difficult Not difficult at all        Past Medical History:  Diagnosis Date   Anxiety    Thyroid disease     Past Surgical History:  Procedure Laterality Date   NO PAST SURGERIES      Family History  Problem Relation Age of Onset   Hyperthyroidism Mother     Social History   Socioeconomic History   Marital status: Single    Spouse name: Not on file  Number of children: Not on file   Years of education: Not on file   Highest education level: Not on file  Occupational History   Not on file  Tobacco Use   Smoking status: Former Smoker   Smokeless tobacco: Never Used  Substance and Sexual Activity   Alcohol use: Yes   Drug use: No   Sexual activity: Yes  Other Topics Concern   Not on file  Social History Narrative   Not on file   Social Determinants of Health   Financial Resource Strain:    Difficulty of Paying Living Expenses: Not on file  Food Insecurity:    Worried About Running Out of Food in the Last Year: Not on file    Ran Out of Food in the Last Year: Not on file  Transportation Needs:    Lack of Transportation (Medical): Not on file   Lack of Transportation (Non-Medical): Not on file  Physical Activity:    Days of Exercise per Week: Not on file   Minutes of Exercise per Session: Not on file  Stress:    Feeling of Stress : Not on file  Social Connections:    Frequency of Communication with Friends and Family: Not on file   Frequency of Social Gatherings with Friends and Family: Not on file   Attends Religious Services: Not on file   Active Member of Clubs or Organizations: Not on file   Attends Banker Meetings: Not on file   Marital Status: Not on file     Observations/Objective: Awake, alert and oriented x 3   Review of Systems  Constitutional: Negative for fever, malaise/fatigue and weight loss.  HENT: Negative.  Negative for nosebleeds.   Eyes: Negative.  Negative for blurred vision, double vision and photophobia.  Respiratory: Negative.  Negative for cough and shortness of breath.   Cardiovascular: Positive for palpitations (tachycardia). Negative for chest pain and leg swelling.  Gastrointestinal: Negative.  Negative for heartburn, nausea and vomiting.  Musculoskeletal: Negative.  Negative for myalgias.  Neurological: Negative.  Negative for dizziness, focal weakness, seizures and headaches.  Psychiatric/Behavioral: Negative for suicidal ideas. The patient is nervous/anxious.     Assessment and Plan: [name] was seen today for establish care.  Diagnoses and all orders for this visit:  Encounter to establish care Schedule EKG For tachycardia  Anxiety -     hydrOXYzine (ATARAX/VISTARIL) 25 MG tablet; Take one half or one whole tablet by mouth three times a day as needed for anxiety  Hyperthyroidism Follow up with Endocrinology as instructed   Follow Up Instructions Return in about 6 weeks (around 09/08/2020).     I discussed the assessment and treatment  plan with the patient. The patient was provided an opportunity to ask questions and all were answered. The patient agreed with the plan and demonstrated an understanding of the instructions.   The patient was advised to call back or seek an in-person evaluation if the symptoms worsen or if the condition fails to improve as anticipated.  I provided 18 minutes of non-face-to-face time during this encounter including median intraservice time, reviewing previous notes, labs, imaging, medications and explaining diagnosis and management.  Claiborne Rigg, Frank

## 2020-07-29 ENCOUNTER — Ambulatory Visit: Payer: BC Managed Care – PPO | Attending: Nurse Practitioner

## 2020-07-29 ENCOUNTER — Other Ambulatory Visit: Payer: Self-pay

## 2020-07-29 ENCOUNTER — Ambulatory Visit (HOSPITAL_BASED_OUTPATIENT_CLINIC_OR_DEPARTMENT_OTHER): Payer: BC Managed Care – PPO

## 2020-07-29 ENCOUNTER — Other Ambulatory Visit: Payer: Self-pay | Admitting: Nurse Practitioner

## 2020-07-29 DIAGNOSIS — Z1159 Encounter for screening for other viral diseases: Secondary | ICD-10-CM

## 2020-07-29 DIAGNOSIS — R000 Tachycardia, unspecified: Secondary | ICD-10-CM | POA: Diagnosis not present

## 2020-07-29 NOTE — Progress Notes (Signed)
Pt. Came into the clinic for a EKG. EKG was performed and informed patient PCP will review it.

## 2020-07-30 LAB — CBC
Hematocrit: 38.7 % (ref 34.0–46.6)
Hemoglobin: 12.7 g/dL (ref 11.1–15.9)
MCH: 27.3 pg (ref 26.6–33.0)
MCHC: 32.8 g/dL (ref 31.5–35.7)
MCV: 83 fL (ref 79–97)
Platelets: 343 10*3/uL (ref 150–450)
RBC: 4.66 x10E6/uL (ref 3.77–5.28)
RDW: 14.4 % (ref 11.7–15.4)
WBC: 8 10*3/uL (ref 3.4–10.8)

## 2020-07-30 LAB — LIPID PANEL
Chol/HDL Ratio: 2.8 ratio (ref 0.0–4.4)
Cholesterol, Total: 187 mg/dL (ref 100–199)
HDL: 67 mg/dL (ref >39)
LDL Chol Calc (NIH): 103 mg/dL — ABNORMAL HIGH (ref 0–99)
Triglycerides: 95 mg/dL (ref 0–149)
VLDL Cholesterol Cal: 17 mg/dL (ref 5–40)

## 2020-07-30 LAB — CMP14+EGFR
ALT: 9 IU/L (ref 0–32)
AST: 14 IU/L (ref 0–40)
Albumin/Globulin Ratio: 1.6 (ref 1.2–2.2)
Albumin: 4.3 g/dL (ref 3.9–5.0)
Alkaline Phosphatase: 189 IU/L — ABNORMAL HIGH (ref 44–121)
BUN/Creatinine Ratio: 13 (ref 9–23)
BUN: 9 mg/dL (ref 6–20)
Bilirubin Total: 0.7 mg/dL (ref 0.0–1.2)
CO2: 23 mmol/L (ref 20–29)
Calcium: 9.1 mg/dL (ref 8.7–10.2)
Chloride: 107 mmol/L — ABNORMAL HIGH (ref 96–106)
Creatinine, Ser: 0.72 mg/dL (ref 0.57–1.00)
GFR calc Af Amer: 133 mL/min/{1.73_m2} (ref >59)
GFR calc non Af Amer: 115 mL/min/{1.73_m2} (ref >59)
Globulin, Total: 2.7 g/dL (ref 1.5–4.5)
Glucose: 81 mg/dL (ref 65–99)
Potassium: 3.8 mmol/L (ref 3.5–5.2)
Sodium: 143 mmol/L (ref 134–144)
Total Protein: 7 g/dL (ref 6.0–8.5)

## 2020-07-30 LAB — HEPATITIS C ANTIBODY: Hep C Virus Ab: <0.1 s/co ratio (ref 0.0–0.9)

## 2020-08-16 ENCOUNTER — Telehealth: Payer: Self-pay

## 2020-08-16 NOTE — Telephone Encounter (Signed)
New message    The patient is asking the nurse to call her back to discuss paperwork.

## 2020-08-17 NOTE — Telephone Encounter (Signed)
Please see below.

## 2020-08-17 NOTE — Telephone Encounter (Signed)
Outbound call to patient requesting a call back further explaining what paperwork she needs to discuss.

## 2020-08-18 ENCOUNTER — Encounter: Payer: Self-pay | Admitting: Endocrinology

## 2020-08-18 ENCOUNTER — Other Ambulatory Visit: Payer: Self-pay

## 2020-08-18 ENCOUNTER — Ambulatory Visit (INDEPENDENT_AMBULATORY_CARE_PROVIDER_SITE_OTHER): Payer: BC Managed Care – PPO | Admitting: Endocrinology

## 2020-08-18 ENCOUNTER — Ambulatory Visit: Payer: BC Managed Care – PPO | Admitting: Endocrinology

## 2020-08-18 VITALS — BP 122/78 | HR 98 | Ht 72.0 in | Wt 153.0 lb

## 2020-08-18 DIAGNOSIS — E0590 Thyrotoxicosis, unspecified without thyrotoxic crisis or storm: Secondary | ICD-10-CM

## 2020-08-18 LAB — TSH: TSH: 5.2 u[IU]/mL — ABNORMAL HIGH (ref 0.35–4.50)

## 2020-08-18 LAB — T4, FREE: Free T4: 0.2 ng/dL — ABNORMAL LOW (ref 0.60–1.60)

## 2020-08-18 MED ORDER — LEVOTHYROXINE SODIUM 100 MCG PO TABS: 100.0000 ug | ORAL_TABLET | Freq: Every day | ORAL | 3 refills | Status: DC

## 2020-08-18 NOTE — Patient Instructions (Signed)
Blood tests are requested for you today.  We'll let you know about the results.  Please come back for a follow-up appointment in 4 weeks.

## 2020-08-18 NOTE — Progress Notes (Signed)
Subjective:    Patient ID: Alexandria Frank, female    DOB: 02/02/1991, 29 y.o.   MRN: [id number]  HPI Pt returns for f/u of hyperthyroidism (due to Grave's Dz; dx'ed 2021; tapazole and inderal were initial rx, due to severity; therapy has been limited by noncompliance, she had RAI rx in 9/21).  pt states she feels much better in general.  Specifically, she denies palpitations and tremor.   Past Medical History:  Diagnosis Date  . Anxiety   . Thyroid disease     Past Surgical History:  Procedure Laterality Date  . NO PAST SURGERIES      Social History   Socioeconomic History  . Marital status: Single    Spouse name: Not on file  . Number of children: Not on file  . Years of education: Not on file  . Highest education level: Not on file  Occupational History  . Not on file  Tobacco Use  . Smoking status: Former Games developer  . Smokeless tobacco: Never Used  Substance and Sexual Activity  . Alcohol use: Yes  . Drug use: No  . Sexual activity: Yes  Other Topics Concern  . Not on file  Social History Narrative  . Not on file   Social Determinants of Health   Financial Resource Strain:   . Difficulty of Paying Living Expenses: Not on file  Food Insecurity:   . Worried About Programme researcher, broadcasting/film/video in the Last Year: Not on file  . Ran Out of Food in the Last Year: Not on file  Transportation Needs:   . Lack of Transportation (Medical): Not on file  . Lack of Transportation (Non-Medical): Not on file  Physical Activity:   . Days of Exercise per Week: Not on file  . Minutes of Exercise per Session: Not on file  Stress:   . Feeling of Stress : Not on file  Social Connections:   . Frequency of Communication with Friends and Family: Not on file  . Frequency of Social Gatherings with Friends and Family: Not on file  . Attends Religious Services: Not on file  . Active Member of Clubs or Organizations: Not on file  . Attends Banker Meetings: Not on file  . Marital  Status: Not on file  Intimate Partner Violence:   . Fear of Current or Ex-Partner: Not on file  . Emotionally Abused: Not on file  . Physically Abused: Not on file  . Sexually Abused: Not on file    Current Outpatient Medications on File Prior to Visit  Medication Sig Dispense Refill  . hydrOXYzine (ATARAX/VISTARIL) 25 MG tablet Take one half or one whole tablet by mouth three times a day as needed for anxiety 30 tablet 2   No current facility-administered medications on file prior to visit.    No Known Allergies  Family History  Problem Relation Age of Onset  . Hyperthyroidism Mother     BP 122/78   Pulse 98   Ht 6' (1.829 m)   Wt 153 lb (69.4 kg)   LMP 07/24/2020   SpO2 100%   BMI 20.75 kg/m    Review of Systems     Objective:   Physical Exam VITAL SIGNS:  See vs page.   GENERAL: no distress.  NECK: thyroid is 3-5 times normal size, L>R  Lab Results  Component Value Date   TSH 5.20 (H) 08/18/2020       Assessment & Plan:  Hypothyroidism, new, due to Radioactive iodine  treatment pill.  I have sent a prescription to your pharmacy, to start synthroid

## 2020-09-08 ENCOUNTER — Other Ambulatory Visit: Payer: Self-pay

## 2020-09-08 ENCOUNTER — Ambulatory Visit: Payer: BC Managed Care – PPO | Attending: Nurse Practitioner | Admitting: Nurse Practitioner

## 2020-09-08 ENCOUNTER — Encounter: Payer: Self-pay | Admitting: Nurse Practitioner

## 2020-09-08 VITALS — BP 124/79 | HR 97 | Temp 98.0°F | Ht 71.65 in | Wt 157.0 lb

## 2020-09-08 DIAGNOSIS — Z0000 Encounter for general adult medical examination without abnormal findings: Secondary | ICD-10-CM | POA: Diagnosis not present

## 2020-09-08 NOTE — Progress Notes (Signed)
Assessment & Plan:  Alexandria Frank was seen today for annual exam.  Diagnoses and all orders for this visit:  Encounter for annual physical exam    Patient has been counseled on age-appropriate routine health concerns for screening and prevention. These are reviewed and up-to-date. Referrals have been placed accordingly. Immunizations are up-to-date or declined.    Subjective:   Chief Complaint  Patient presents with  . Annual Exam    Pt. is here for a physical exam.    HPI Alexandria Frank 29 y.o. female presents to office today for annual physical.  She has a history of anxiety and hyperthyroidism. Being followed by endocrinology for her thyroid disease. She was previously taking propranolol for tachycardia. She has stopped taking and heart rate is normal at this time.  Lab Results  Component Value Date   TSH 5.20 (H) 08/18/2020   Currently taking hydroxyzine 12.5 mg instead of 25 mg. States the 25 mg made her a little drowsy. Notes improvement and reduction in symptoms of anxiety. Will continue current dose.   Review of Systems  Constitutional: Negative for fever, malaise/fatigue and weight loss.  HENT: Negative.  Negative for nosebleeds.   Eyes: Negative.  Negative for blurred vision, double vision and photophobia.  Respiratory: Negative.  Negative for cough and shortness of breath.   Cardiovascular: Negative.  Negative for chest pain, palpitations and leg swelling.  Gastrointestinal: Negative.  Negative for heartburn, nausea and vomiting.  Genitourinary: Negative.   Musculoskeletal: Negative.  Negative for myalgias.  Skin: Negative.   Neurological: Negative.  Negative for dizziness, focal weakness, seizures and headaches.  Endo/Heme/Allergies: Negative.   Psychiatric/Behavioral: Negative for suicidal ideas. The patient is nervous/anxious.     Past Medical History:  Diagnosis Date  . Anxiety   . Thyroid disease     Past Surgical History:  Procedure Laterality Date  . NO  PAST SURGERIES      Family History  Problem Relation Age of Onset  . Hyperthyroidism Mother     Social History Reviewed with no changes to be made today.   Outpatient Medications Prior to Visit  Medication Sig Dispense Refill  . hydrOXYzine (ATARAX/VISTARIL) 25 MG tablet Take one half or one whole tablet by mouth three times a day as needed for anxiety 30 tablet 2  . levothyroxine (SYNTHROID) 100 MCG tablet Take 1 tablet (100 mcg total) by mouth daily. (Patient not taking: Reported on 09/08/2020) 90 tablet 3   No facility-administered medications prior to visit.    No Known Allergies     Objective:    BP 124/79 (BP Location: Left Arm, Patient Position: Sitting, Cuff Size: Normal)   Pulse 97   Temp 98 F (36.7 C) (Temporal)   Ht 5' 11.65" (1.82 m)   Wt 157 lb (71.2 kg)   LMP 08/20/2020   SpO2 99%   BMI 21.50 kg/m  Wt Readings from Last 3 Encounters:  09/08/20 157 lb (71.2 kg)  08/18/20 153 lb (69.4 kg)  07/28/20 145 lb (65.8 kg)    Physical Exam Constitutional:      Appearance: She is well-developed.  HENT:     Head: Normocephalic and atraumatic.     Right Ear: External ear normal.     Left Ear: External ear normal.     Nose: Nose normal.     Mouth/Throat:     Pharynx: No oropharyngeal exudate.  Eyes:     General: No scleral icterus.       Right eye: No discharge.  Conjunctiva/sclera: Conjunctivae normal.     Pupils: Pupils are equal, round, and reactive to light.  Neck:     Thyroid: No thyromegaly.     Trachea: No tracheal deviation.  Cardiovascular:     Rate and Rhythm: Normal rate and regular rhythm.     Heart sounds: Normal heart sounds. No murmur heard.  No friction rub.  Pulmonary:     Effort: Pulmonary effort is normal. No accessory muscle usage or respiratory distress.     Breath sounds: Normal breath sounds. No decreased breath sounds, wheezing, rhonchi or rales.  Chest:     Chest wall: No tenderness.     Breasts: Breasts are symmetrical.         Right: No inverted nipple, mass, nipple discharge, skin change or tenderness.        Left: No inverted nipple, mass, nipple discharge, skin change or tenderness.  Abdominal:     General: Bowel sounds are normal. There is no distension.     Palpations: Abdomen is soft. There is no mass.     Tenderness: There is no abdominal tenderness. There is no guarding or rebound.  Musculoskeletal:        General: No tenderness or deformity. Normal range of motion.     Cervical back: Normal range of motion and neck supple.  Lymphadenopathy:     Cervical: No cervical adenopathy.  Skin:    General: Skin is warm and dry.     Findings: No erythema.  Neurological:     Mental Status: She is alert and oriented to person, place, and time.     Cranial Nerves: No cranial nerve deficit.     Coordination: Coordination normal.     Deep Tendon Reflexes: Reflexes are normal and symmetric.  Psychiatric:        Speech: Speech normal.        Behavior: Behavior normal.        Thought Content: Thought content normal.        Judgment: Judgment normal.          Patient has been counseled extensively about nutrition and exercise as well as the importance of adherence with medications and regular follow-up. The patient was given clear instructions to go to ER or return to medical center if symptoms don't improve, worsen or new problems develop. The patient verbalized understanding.   Follow-up: Return in about 6 months (around 03/09/2021).   Claiborne Rigg, FNP-BC [facility] [place], [place] [phone]   09/08/2020, 3:04 PM

## 2020-09-15 ENCOUNTER — Ambulatory Visit (INDEPENDENT_AMBULATORY_CARE_PROVIDER_SITE_OTHER): Payer: BC Managed Care – PPO | Admitting: Endocrinology

## 2020-09-15 DIAGNOSIS — E0590 Thyrotoxicosis, unspecified without thyrotoxic crisis or storm: Secondary | ICD-10-CM

## 2020-09-18 NOTE — Progress Notes (Signed)
No show

## 2020-10-04 ENCOUNTER — Telehealth: Payer: Self-pay | Admitting: Endocrinology

## 2020-10-04 NOTE — Telephone Encounter (Signed)
F/u ov is due.  Let's address then 

## 2020-10-04 NOTE — Telephone Encounter (Signed)
Spoken to patient and she believe she is having side effects from the levothyroxine. Patient is having chills and headache since been taking meds for 4-3 days now. Please advise.

## 2020-10-04 NOTE — Telephone Encounter (Signed)
Patient called stating she had a question about her levothyroxine and wanted to speak to the nurse - informed her I would have someone give her a call back. She did not specify what the question was. Ph# [phone]

## 2020-10-06 ENCOUNTER — Encounter: Payer: Self-pay | Admitting: Endocrinology

## 2020-10-06 ENCOUNTER — Ambulatory Visit (INDEPENDENT_AMBULATORY_CARE_PROVIDER_SITE_OTHER): Payer: BC Managed Care – PPO | Admitting: Endocrinology

## 2020-10-06 ENCOUNTER — Other Ambulatory Visit: Payer: Self-pay

## 2020-10-06 VITALS — BP 110/60 | HR 60 | Ht 70.0 in | Wt 153.0 lb

## 2020-10-06 DIAGNOSIS — E0590 Thyrotoxicosis, unspecified without thyrotoxic crisis or storm: Secondary | ICD-10-CM

## 2020-10-06 LAB — TSH: TSH: 0.01 u[IU]/mL — ABNORMAL LOW (ref 0.35–4.50)

## 2020-10-06 LAB — T4, FREE: Free T4: 1.45 ng/dL (ref 0.60–1.60)

## 2020-10-06 NOTE — Patient Instructions (Addendum)
Blood tests are requested for you today.  We'll let you know about the results.  If it is low, I'll prescribe for you a lower amount of the medication.   Please come back for a follow-up appointment in 1 month.

## 2020-10-06 NOTE — Progress Notes (Signed)
   Subjective:    Patient ID: Alexandria Frank, female    DOB: 25-Dec-1990, 30 y.o.   MRN: [id number]  HPI Pt returns for f/u of hyperthyroidism (due to Grave's Dz; dx'ed 2021; tapazole and inderal were initial rx, due to severity; therapy has been limited by noncompliance, she had RAI rx in 9/21).  pt states she feels well in general.  She has not recently taken synthroid, because it caused doe and palpitations.   Past Medical History:  Diagnosis Date  . Anxiety   . Thyroid disease     Past Surgical History:  Procedure Laterality Date  . NO PAST SURGERIES      Social History   Socioeconomic History  . Marital status: Single    Spouse name: Not on file  . Number of children: Not on file  . Years of education: Not on file  . Highest education level: Not on file  Occupational History  . Not on file  Tobacco Use  . Smoking status: Former Games developer  . Smokeless tobacco: Never Used  Substance and Sexual Activity  . Alcohol use: Yes  . Drug use: No  . Sexual activity: Yes  Other Topics Concern  . Not on file  Social History Narrative  . Not on file   Social Determinants of Health   Financial Resource Strain: Not on file  Food Insecurity: Not on file  Transportation Needs: Not on file  Physical Activity: Not on file  Stress: Not on file  Social Connections: Not on file  Intimate Partner Violence: Not on file    Current Outpatient Medications on File Prior to Visit  Medication Sig Dispense Refill  . hydrOXYzine (ATARAX/VISTARIL) 25 MG tablet Take one half or one whole tablet by mouth three times a day as needed for anxiety 30 tablet 2   No current facility-administered medications on file prior to visit.    No Known Allergies  Family History  Problem Relation Age of Onset  . Hyperthyroidism Mother     BP 110/60   Pulse 60   Ht 5\' 10"  (1.778 m)   Wt 153 lb (69.4 kg)   SpO2 95%   BMI 21.95 kg/m    Review of Systems     Objective:   Physical Exam VITAL SIGNS:   See vs page.   GENERAL: no distress.  NECK: thyroid is 3 times normal size, with MNG surface  Lab Results  Component Value Date   TSH 0.01 (L) 10/06/2020       Assessment & Plan:  Hyperthyroidism: worse since last OV.  Due to the changing situation, best decision is to hold off on medication for now. Please come back for a follow-up appointment in 1 month.

## 2020-10-12 DIAGNOSIS — Z1152 Encounter for screening for COVID-19: Secondary | ICD-10-CM | POA: Diagnosis not present

## 2020-10-13 DIAGNOSIS — L900 Lichen sclerosus et atrophicus: Secondary | ICD-10-CM | POA: Diagnosis not present

## 2020-10-13 DIAGNOSIS — N760 Acute vaginitis: Secondary | ICD-10-CM | POA: Diagnosis not present

## 2020-10-13 DIAGNOSIS — Z113 Encounter for screening for infections with a predominantly sexual mode of transmission: Secondary | ICD-10-CM | POA: Diagnosis not present

## 2020-11-01 ENCOUNTER — Telehealth: Payer: Self-pay | Admitting: Nurse Practitioner

## 2020-11-01 NOTE — Telephone Encounter (Signed)
Copied from CRM [id number]. Topic: General - Other >> Oct 29, 2020  2:02 PM Laural Benes, [place] C wrote: Reason for CRM: pt called in to request a referral to see a Thyroid provider.   Please assist. [phone]

## 2020-11-04 NOTE — Telephone Encounter (Signed)
Patient is requesting a referral to another Thyroid specialist.  Patient stated she is not satisfied with her current thyroid provider.

## 2020-11-10 ENCOUNTER — Ambulatory Visit: Payer: BC Managed Care – PPO | Admitting: Endocrinology

## 2020-12-27 DIAGNOSIS — Z20822 Contact with and (suspected) exposure to covid-19: Secondary | ICD-10-CM | POA: Diagnosis not present

## 2020-12-28 ENCOUNTER — Telehealth: Payer: Self-pay | Admitting: Endocrinology

## 2020-12-28 NOTE — Telephone Encounter (Signed)
Message sent to Staff message

## 2021-01-07 ENCOUNTER — Telehealth: Payer: Self-pay | Admitting: Internal Medicine

## 2021-01-07 NOTE — Telephone Encounter (Signed)
New message    Patient switch MD from Dr. Everardo All to Dr. Elvera Lennox. C/o thyroid issues previous medication was taken off wanted to know if their anything she can take in the meantime until she see Dr. Elvera Lennox.

## 2021-01-07 NOTE — Telephone Encounter (Signed)
I advise wait until you see Dr Elvera Lennox

## 2021-01-07 NOTE — Telephone Encounter (Signed)
Called and spoke with pt advising Dr Elvera Lennox can not prescribe anything until she is seen in the office. Pt requested Dr Everardo All send a low dose of her previous Thyroid medication to help until she is seen in June.

## 2021-01-07 NOTE — Telephone Encounter (Signed)
T, Unfortunately, I cannot make any suggestions until I see her for the first visit.  I see she is scheduled in June. Ty, C

## 2021-01-07 NOTE — Telephone Encounter (Signed)
Pt notified via MyChart message.

## 2021-01-07 NOTE — Telephone Encounter (Signed)
Please advise 

## 2021-01-13 DIAGNOSIS — Z6821 Body mass index (BMI) 21.0-21.9, adult: Secondary | ICD-10-CM | POA: Diagnosis not present

## 2021-01-13 DIAGNOSIS — B370 Candidal stomatitis: Secondary | ICD-10-CM | POA: Diagnosis not present

## 2021-03-01 ENCOUNTER — Ambulatory Visit (INDEPENDENT_AMBULATORY_CARE_PROVIDER_SITE_OTHER): Payer: Self-pay | Admitting: Internal Medicine

## 2021-03-01 ENCOUNTER — Other Ambulatory Visit: Payer: Self-pay

## 2021-03-01 ENCOUNTER — Encounter: Payer: Self-pay | Admitting: Internal Medicine

## 2021-03-01 VITALS — BP 138/88 | HR 123 | Ht 70.0 in | Wt 146.2 lb

## 2021-03-01 DIAGNOSIS — E0590 Thyrotoxicosis, unspecified without thyrotoxic crisis or storm: Secondary | ICD-10-CM

## 2021-03-01 LAB — TSH: TSH: <0.01 u[IU]/mL — ABNORMAL LOW (ref 0.35–4.50)

## 2021-03-01 LAB — T3, FREE: T3, Free: 5.1 pg/mL — ABNORMAL HIGH (ref 2.3–4.2)

## 2021-03-01 LAB — T4, FREE: Free T4: 1.7 ng/dL — ABNORMAL HIGH (ref 0.60–1.60)

## 2021-03-01 MED ORDER — PROPRANOLOL HCL 20 MG PO TABS: 20.0000 mg | ORAL_TABLET | Freq: Three times a day (TID) | ORAL | 3 refills | Status: DC

## 2021-03-01 NOTE — Patient Instructions (Signed)
Please stop at the lab.  Start Inderal 20 mg (2-)3x a day. Target: pulse <90 at rest.  Please come back for a follow-up appointment in 3 months.   Hyperthyroidism  Hyperthyroidism is when the thyroid gland is too active (overactive). The thyroid gland is a small gland located in the lower front part of the neck, just in front of the windpipe (trachea). This gland makes hormones that help control how the body uses food for energy (metabolism) as well as how the heart and brain function. These hormones also play a role in keeping your bones strong. When the thyroid is overactive, it produces too much of a hormone called thyroxine. What are the causes? This condition may be caused by:  Graves' disease. This is a disorder in which the body's disease-fighting system (immune system) attacks the thyroid gland. This is the most common cause.  Inflammation of the thyroid gland.  A tumor in the thyroid gland.  Use of certain medicines, including: ? Prescription thyroid hormone replacement. ? Herbal supplements that mimic thyroid hormones. ? Amiodarone therapy.  Solid or fluid-filled lumps within your thyroid gland (thyroid nodules).  Taking in a large amount of iodine from foods or medicines. What increases the risk? You are more likely to develop this condition if:  You are female.  You have a family history of thyroid conditions.  You smoke tobacco.  You use a medicine called lithium.  You take medicines that affect the immune system (immunosuppressants). What are the signs or symptoms? Symptoms of this condition include:  Nervousness.  Inability to tolerate heat.  Unexplained weight loss.  Diarrhea.  Change in the texture of hair or skin.  Heart skipping beats or making extra beats.  Rapid heart rate.  Loss of menstruation.  Shaky hands.  Fatigue.  Restlessness.  Sleep problems.  Enlarged thyroid gland or a lump in the thyroid (nodule). You may also have  symptoms of Graves' disease, which may include:  Protruding eyes.  Dry eyes.  Red or swollen eyes.  Problems with vision. How is this diagnosed? This condition may be diagnosed based on:  Your symptoms and medical history.  A physical exam.  Blood tests.  Thyroid ultrasound. This test involves using sound waves to produce images of the thyroid gland.  A thyroid scan. A radioactive substance is injected into a vein, and images show how much iodine is present in the thyroid.  Radioactive iodine uptake test (RAIU). A small amount of radioactive iodine is given by mouth to see how much iodine the thyroid absorbs after a certain amount of time. How is this treated? Treatment depends on the cause and severity of the condition. Treatment may include:  Medicines to reduce the amount of thyroid hormone your body makes.  Radioactive iodine treatment (radioiodine therapy). This involves swallowing a small dose of radioactive iodine, in capsule or liquid form, to kill thyroid cells.  Surgery to remove part or all of your thyroid gland. You may need to take thyroid hormone replacement medicine for the rest of your life after thyroid surgery.  Medicines to help manage your symptoms. Follow these instructions at home:  Take over-the-counter and prescription medicines only as told by your health care provider.  Do not use any products that contain nicotine or tobacco, such as cigarettes and e-cigarettes. If you need help quitting, ask your health care provider.  Follow any instructions from your health care provider about diet. You may be instructed to limit foods that contain iodine.  Keep all follow-up visits as told by your health care provider. This is important. ? You will need to have blood tests regularly so that your health care provider can monitor your condition.   Contact a health care provider if:  Your symptoms do not get better with treatment.  You have a fever.  You  are taking thyroid hormone replacement medicine and you: ? Have symptoms of depression. ? Feel like you are tired all the time. ? Gain weight. Get help right away if:  You have chest pain.  You have decreased alertness or a change in your awareness.  You have abdominal pain.  You feel dizzy.  You have a rapid heartbeat.  You have an irregular heartbeat.  You have difficulty breathing. Summary  The thyroid gland is a small gland located in the lower front part of the neck, just in front of the windpipe (trachea).  Hyperthyroidism is when the thyroid gland is too active (overactive) and produces too much of a hormone called thyroxine.  The most common cause is Graves' disease, a disorder in which your immune system attacks the thyroid gland.  Hyperthyroidism can cause various symptoms, such as unexplained weight loss, nervousness, inability to tolerate heat, or changes in your heartbeat.  Treatment may include medicine to reduce the amount of thyroid hormone your body makes, radioiodine therapy, surgery, or medicines to manage symptoms. This information is not intended to replace advice given to you by your health care provider. Make sure you discuss any questions you have with your health care provider. Document Revised: 06/03/2020 Document Reviewed: 06/03/2020 Elsevier Patient Education  2021 [employer].

## 2021-03-01 NOTE — Progress Notes (Addendum)
Patient ID: Alexandria Frank, female   DOB: December 20, 1990, 29 y.o.   MRN: [id number]  This visit occurred during the SARS-CoV-2 public health emergency.  Safety protocols were in place, including screening questions prior to the visit, additional usage of staff PPE, and extensive cleaning of exam room while observing appropriate contact time as indicated for disinfecting solutions.   HPI  Alexandria Frank is a 30 y.o.-year-old female, referred by her PCP, Alexandria Rigg, NP, for management of post ablative hypothyroidism.  She previously saw Dr. Everardo Frank, last visit with him 09/15/2020.  Pt. has been dx with Graves' disease in 2021 after she presented with nausea, vomiting, wt loss (25 lbs in 3-4 mo), tremors, palpitations, SOB, and goiter. She had a thyroid uptake and scan (06/02/2020) showing 81.5% uptake in the homogeneous scan.   She was initially on methimazole and Inderal. She had RAI treatment on 06/24/2020. She developed post ablative hypothyroidism afterwards, with a TSH of 5.2 in 08/2020, but she could not tolerate levothyroxine 100 mcg due to return of her symptoms, so she stopped the medication.  In 10/2020, TSH was suppressed off the medication.  She now complains of: - + itching - + heat intolerance - + hair loss (new) - no palpitations now, had these initially - no SOB now, had this initially - + insomnia - + anxiety- on Atarax - + hyperdefecation  She was on levothyroxine 100 mcg daily: - fasting or after b'fast - with water - separated by >30 min from b'fast  - no calcium, iron, PPIs, multivitamins   I reviewed pt's thyroid tests: Lab Results  Component Value Date   TSH 0.01 (L) 10/06/2020   TSH 5.20 (H) 08/18/2020   TSH <0.01 (L) 04/19/2020   TSH <0.005 (L) 12/15/2019   FREET4 1.45 10/06/2020   FREET4 0.20 (L) 08/18/2020   FREET4 3.29 (H) 04/19/2020   FREET4 >7.77 (H) 12/15/2019  No results found for: T3FREE   Antithyroid antibodies: No results found for: TSI  No  results found for: THGAB No components found for: TPOAB  Pt denies feeling nodules in neck, hoarseness, dysphagia/odynophagia, SOB with lying down.  Pt. also has a history of M, M aunt, MGM.No FH of thyroid cancer.  No h/o radiation tx to head or neck other than RAI tx. No recent use of iodine supplements. No Biotin.  ROS: + See HPI Constitutional: + weight loss, no fatigue, + subjective hyperthermia, + poor sleep Eyes: no blurry vision, no xerophthalmia ENT: no sore throat, no nodules palpated in neck, no dysphagia/odynophagia, no hoarseness Cardiovascular: no CP/SOB/+ palpitations/no leg swelling Respiratory: no cough/SOB Gastrointestinal: no N/V/D/C Musculoskeletal: no muscle/joint aches Skin: no rashes Neurological: no tremors/numbness/tingling/dizziness Psychiatric: no depression/+ anxiety  Past Medical History:  Diagnosis Date  . Anxiety   . Thyroid disease    Past Surgical History:  Procedure Laterality Date  . NO PAST SURGERIES     Social History   Socioeconomic History  . Marital status: Single    Spouse name: Not on file  . Number of children: 0  . Years of education: Not on file  . Highest education level: Not on file  Occupational History  . Occupation: Customer service/call center  Tobacco Use  . Smoking status: Never Smoker  . Smokeless tobacco: Never Used  Substance and Sexual Activity  . Alcohol use: Yes    Comment: tequilla 3 drinks every 2 weeks  . Drug use: Yes    Types: Marijuana    Comment: every 2 weeks  since  . Sexual activity: Yes  Other Topics Concern  . Not on file  Social History Narrative  . Not on file   Social Determinants of Health   Financial Resource Strain: Not on file  Food Insecurity: Not on file  Transportation Needs: Not on file  Physical Activity: Not on file  Stress: Not on file  Social Connections: Not on file  Intimate Partner Violence: Not on file   Current Outpatient Medications on File Prior to Visit   Medication Sig Dispense Refill  . hydrOXYzine (ATARAX/VISTARIL) 25 MG tablet Take one half or one whole tablet by mouth three times a day as needed for anxiety 30 tablet 2   No current facility-administered medications on file prior to visit.   No Known Allergies Family History  Problem Relation Age of Onset  . Hyperthyroidism Mother   . Hyperthyroidism Maternal Aunt   . Hyperthyroidism Maternal Grandmother    PE: BP 138/88 (BP Location: Right Arm, Patient Position: Sitting, Cuff Size: Normal)   Pulse (!) 123   Ht 5\' 10"  (1.778 m)   Wt 146 lb 3.2 oz (66.3 kg)   SpO2 98%   BMI 20.98 kg/m  Wt Readings from Last 3 Encounters:  03/01/21 146 lb 3.2 oz (66.3 kg)  10/06/20 153 lb (69.4 kg)  09/08/20 157 lb (71.2 kg)   Constitutional: normal weight, in NAD Eyes: PERRLA, EOMI, no exophthalmos ENT: moist mucous membranes, no thyromegaly, no cervical lymphadenopathy Cardiovascular: tachycardia, RR, No MRG Respiratory: CTA B Gastrointestinal: abdomen soft, NT, ND, BS+ Musculoskeletal: no deformities, strength intact in Frank 4 Skin: moist, warm, no rashes Neurological: no tremor with outstretched hands, DTR normal in Frank 4  ASSESSMENT: 1.  Graves' disease  PLAN:  1. Patient with thyrotoxicosis diagnosed last year after she presented with congruent symptoms: Weight loss, palpitations, tremors, shortness of breath, goiter.  She was started initially on Inderal and methimazole, but had RAI treatment in 06/2020.  She felt better after this but the TSH returned high, at 5 so she was started on levothyroxine 100 mcg afterwards.  She did not tolerate this well, with return of her pretreatment symptoms.  At that time, she stopped levothyroxine.  She had another TSH obtained in 10/2020, off medication, which was suppressed. - At today's visit, she is off levothyroxine - she does not appear to have a goiter, thyroid nodules, or neck compression symptoms - However, she again has signs and symptoms  of thyrotoxicosis: Weight loss of 11 pounds in 6 months, palpitations and heart rate of 123 on arrival, itching, anxiety, insomnia, but no shortness of breath or the GI symptoms that she initially had. - We discussed that it is possible that her RAI treatment did not work and she may need either retreatment for treatment with methimazole.  We did discuss about correct taking of levothyroxine if TSH is elevated today, however, which I doubt: fasting, with water, separated by at least 30 minutes from breakfast, and separated by more than 4 hours from calcium, iron, multivitamins, acid reflux medications (PPIs). - will check thyroid tests today: TSH, free T4, free T3 and we will also check her TSI antibodies - we decided to try methimazole afterwards and see how she responds to this now after RAI treatment.  If she is not responding or the tests are very abnormal, we may need a repeat RAI treatment. - for now, we will also start her back on Inderal at 20 mg 3 times a day. - She will  likely need to return in ~4-5 weeks for repeat labs - Otherwise, I will see her back in 3 months  - Total time spent for the visit: 40 minutes, in obtaining medical information from the chart, reviewing her  previous labs, imaging evaluations, and treatments, reviewing her symptoms, counseling her about her condition (please see the discussed topics above), and developing a plan to further investigate and treat it; she had a number of questions which I addressed.  Component     Latest Ref Rng & Units 03/01/2021  TSH     0.35 - 4.50 uIU/mL <0.01 (L)  T4,Free(Direct)     0.60 - 1.60 ng/dL 9.48 (H)  Triiodothyronine,Free,Serum     2.3 - 4.2 pg/mL 5.1 (H)  TFTs in the thyrotoxic range.  TSI is pending. For now, I would suggest to add methimazole 5 mg twice a day and recheck her tests in 4 to 5 weeks.  Component     Latest Ref Rng & Units 03/01/2021  TSI     <140 % baseline 491 (H)  TSIs elevated, confirming Graves  ds.  Carlus Pavlov, MD PhD [facility] Endocr[facility]

## 2021-03-02 MED ORDER — METHIMAZOLE 5 MG PO TABS: 5.0000 mg | ORAL_TABLET | Freq: Two times a day (BID) | ORAL | 3 refills | Status: DC

## 2021-03-06 LAB — THYROID STIMULATING IMMUNOGLOBULIN: TSI: 491 % baseline — ABNORMAL HIGH (ref <140)

## 2021-03-10 ENCOUNTER — Ambulatory Visit: Payer: BC Managed Care – PPO | Admitting: Internal Medicine

## 2021-06-02 ENCOUNTER — Ambulatory Visit: Payer: Self-pay | Admitting: Internal Medicine

## 2021-08-05 ENCOUNTER — Other Ambulatory Visit: Payer: Self-pay | Admitting: Nurse Practitioner

## 2021-08-05 DIAGNOSIS — F419 Anxiety disorder, unspecified: Secondary | ICD-10-CM

## 2021-08-05 NOTE — Telephone Encounter (Signed)
Requested medication (s) are due for refill today: expired medication  Requested medication (s) are on the active medication list: yes  Last refill:  07/28/20 #30 2 refills  Future visit scheduled: no  Notes to clinic:  expired medication. Patient out of medication . Do you want to renew Rx?      Requested Prescriptions  Pending Prescriptions Disp Refills   hydrOXYzine (ATARAX/VISTARIL) 25 MG tablet [Pharmacy Med Name: HYDROXYZINE HCL 25 MG TABLET] 30 tablet 2    Sig: Take one half or one whole tablet by mouth three times a day as needed for anxiety     Ear, Nose, and Throat:  Antihistamines Passed - 08/05/2021  9:04 AM      Passed - Valid encounter within last 12 months    Recent Outpatient Visits           11 months ago Encounter for annual physical exam   [facility] [place], Shea Stakes, NP   1 year ago Encounter to establish care   [facility] [place], Shea Stakes, NP   1 year ago Encounter to establish care   [facility] [place], [place], NP   1 year ago Enlarged thyroid gland   [facility] Grayce Sessions, NP   1 year ago Tachycardia with heart rate 121-140 beats per minute   [facility] Grayce Sessions, NP

## 2021-08-19 ENCOUNTER — Other Ambulatory Visit: Payer: Self-pay | Admitting: Nurse Practitioner

## 2021-08-19 DIAGNOSIS — F419 Anxiety disorder, unspecified: Secondary | ICD-10-CM

## 2021-08-19 NOTE — Telephone Encounter (Signed)
Requested Prescriptions  Pending Prescriptions Disp Refills  . hydrOXYzine (ATARAX/VISTARIL) 25 MG tablet [Pharmacy Med Name: HYDROXYZINE HCL 25 MG TABLET] 90 tablet 1    Sig: TAKE ONE HALF OR ONE WHOLE TABLET BY MOUTH THREE TIMES A DAY AS NEEDED FOR ANXIETY     Ear, Nose, and Throat:  Antihistamines Passed - 08/19/2021  8:59 AM      Passed - Valid encounter within last 12 months    Recent Outpatient Visits          11 months ago Encounter for annual physical exam   [facility] [place], Shea Stakes, NP   1 year ago Encounter to establish care   [facility] [place], Shea Stakes, NP   1 year ago Encounter to establish care   [facility] [place], [place], NP   1 year ago Enlarged thyroid gland   [facility] Grayce Sessions, NP   1 year ago Tachycardia with heart rate 121-140 beats per minute   [facility] Grayce Sessions, NP

## 2021-09-20 ENCOUNTER — Other Ambulatory Visit: Payer: Self-pay | Admitting: Nurse Practitioner

## 2021-09-20 DIAGNOSIS — F419 Anxiety disorder, unspecified: Secondary | ICD-10-CM

## 2021-09-20 NOTE — Telephone Encounter (Signed)
Requested medications are due for refill today.  yes  Requested medications are on the active medications list.  yes  Last refill. 08/19/2021  Future visit scheduled.   no  Notes to clinic.  Pharmacy needs Dx code. Pt last seen 09/08/2020.     Requested Prescriptions  Pending Prescriptions Disp Refills   hydrOXYzine (ATARAX) 25 MG tablet [Pharmacy Med Name: HYDROXYZINE HCL 25 MG TABLET] 270 tablet 1    Sig: TAKE ONE HALF OR ONE WHOLE TABLET BY MOUTH THREE TIMES A DAY AS NEEDED FOR ANXIETY     Ear, Nose, and Throat:  Antihistamines Failed - 09/20/2021  1:31 PM      Failed - Valid encounter within last 12 months    Recent Outpatient Visits           1 year ago Encounter for annual physical exam   [facility] [place], Shea Stakes, NP   1 year ago Encounter to establish care   [facility] [place], Shea Stakes, NP   1 year ago Encounter to establish care   [facility] [place], [place], NP   1 year ago Enlarged thyroid gland   [facility] Grayce Sessions, NP   1 year ago Tachycardia with heart rate 121-140 beats per minute   [facility] Grayce Sessions, NP

## 2021-09-22 ENCOUNTER — Ambulatory Visit (INDEPENDENT_AMBULATORY_CARE_PROVIDER_SITE_OTHER): Payer: Self-pay | Admitting: Internal Medicine

## 2021-09-22 ENCOUNTER — Other Ambulatory Visit: Payer: Self-pay

## 2021-09-22 VITALS — BP 128/80 | HR 88 | Ht 70.0 in | Wt 155.8 lb

## 2021-09-22 DIAGNOSIS — E0590 Thyrotoxicosis, unspecified without thyrotoxic crisis or storm: Secondary | ICD-10-CM

## 2021-09-22 DIAGNOSIS — Z91199 Patient's noncompliance with other medical treatment and regimen due to unspecified reason: Secondary | ICD-10-CM

## 2021-09-22 LAB — TSH: TSH: 0.12 u[IU]/mL — ABNORMAL LOW (ref 0.35–5.50)

## 2021-09-22 LAB — T4, FREE: Free T4: 0.54 ng/dL — ABNORMAL LOW (ref 0.60–1.60)

## 2021-09-22 LAB — T3, FREE: T3, Free: 2.9 pg/mL (ref 2.3–4.2)

## 2021-09-22 NOTE — Patient Instructions (Signed)
Please restart: - Methimazole 10 mg in am  Please stop at the lab.  Please come back for a follow-up appointment in 3 months.

## 2021-09-22 NOTE — Progress Notes (Signed)
Patient ID: Alexandria Frank, female   DOB: Jan 19, 1991, 30 y.o.   MRN: [id number]  This visit occurred during the SARS-CoV-2 public health emergency.  Safety protocols were in place, including screening questions prior to the visit, additional usage of staff PPE, and extensive cleaning of exam room while observing appropriate contact time as indicated for disinfecting solutions.   HPI  Alexandria Frank is a 30 y.o.-year-old female, initially referred by her PCP, Claiborne Rigg, NP, returning for follow-up for persistent Graves' disease despite RAI ablation.  She previously saw Dr. Everardo All, last visit with him 09/15/2020.  Last visit with me 6.5 months ago.  Interim history: At this visit, she tells me that she was off and on methimazole since last visit. She developed itching, nervousness, anxiety.  It was unclear whether these were caused by the methimazole or anxiety medication.  She has been now off methimazole for the last 2 weeks.  Her symptoms are not necessarily resolved. Her hair is still thinning.  Reviewed and addended history: Pt. has been dx with Graves' disease in 2021 after she presented with nausea, vomiting, wt loss (25 lbs in 3-4 mo), tremors, palpitations, SOB, and goiter. She had a thyroid uptake and scan (06/02/2020) showing 81.5% uptake in the homogeneous scan.   She was initially on methimazole and Inderal. She had RAI treatment on 06/24/2020. She developed post ablative hypothyroidism afterwards, with a TSH of 5.2 in 08/2020, but she could not tolerate levothyroxine 100 mcg due to return of her symptoms, so she stopped the medication.   In 10/2020, TSH was suppressed off the medication. In 01/2021: TSH was undetectable so we started methimazole 5 mg twice a day She did not return for labs as advised... She was off and on MMI since last OV. She stopped completely 2 weeks ago.  At last visit she complained: - + itching - + heat intolerance - + hair loss (new) - no palpitations  now, had these initially - no SOB now, had this initially - + insomnia - + anxiety- on Atarax - + hyperdefecation As of now, she still has itching and hair thinning along with anxiety, but the other symptoms have resolved.  I reviewed pt's thyroid tests: Lab Results  Component Value Date   TSH <0.01 (L) 03/01/2021   TSH 0.01 (L) 10/06/2020   TSH 5.20 (H) 08/18/2020   TSH <0.01 (L) 04/19/2020   TSH <0.005 (L) 12/15/2019   FREET4 1.70 (H) 03/01/2021   FREET4 1.45 10/06/2020   FREET4 0.20 (L) 08/18/2020   FREET4 3.29 (H) 04/19/2020   FREET4 >7.77 (H) 12/15/2019   T3FREE 5.1 (H) 03/01/2021   Lab Results  Component Value Date   T3FREE 5.1 (H) 03/01/2021    Antithyroid antibodies: Lab Results  Component Value Date   TSI 491 (H) 03/01/2021    No results found for: THGAB No components found for: TPOAB  Pt denies feeling nodules in neck, hoarseness, dysphagia/odynophagia, SOB with lying down.  Pt. also has a history of M, M aunt, MGM.No FH of thyroid cancer.  No h/o radiation tx to head or neck other than RAI tx. No recent use of iodine supplements. No Biotin.  ROS:  + See HPI  Past Medical History:  Diagnosis Date   Anxiety    Thyroid disease    Past Surgical History:  Procedure Laterality Date   NO PAST SURGERIES     Social History   Socioeconomic History   Marital status: Single    Spouse name:  Not on file   Number of children: 0   Years of education: Not on file   Highest education level: Not on file  Occupational History   Occupation: Customer service/call center  Tobacco Use   Smoking status: Never   Smokeless tobacco: Never  Substance and Sexual Activity   Alcohol use: Yes    Comment: tequilla 3 drinks every 2 weeks   Drug use: Yes    Types: Marijuana    Comment: every 2 weeks since   Sexual activity: Yes  Other Topics Concern   Not on file  Social History Narrative   Not on file   Social Determinants of Health   Financial Resource Strain:  Not on file  Food Insecurity: Not on file  Transportation Needs: Not on file  Physical Activity: Not on file  Stress: Not on file  Social Connections: Not on file  Intimate Partner Violence: Not on file   Current Outpatient Medications on File Prior to Visit  Medication Sig Dispense Refill   hydrOXYzine (ATARAX/VISTARIL) 25 MG tablet TAKE ONE HALF OR ONE WHOLE TABLET BY MOUTH THREE TIMES A DAY AS NEEDED FOR ANXIETY 90 tablet 1   methimazole (TAPAZOLE) 5 MG tablet Take 1 tablet (5 mg total) by mouth 2 (two) times daily. 120 tablet 3   propranolol (INDERAL) 20 MG tablet Take 1 tablet (20 mg total) by mouth 3 (three) times daily. 270 tablet 3   No current facility-administered medications on file prior to visit.   No Known Allergies Family History  Problem Relation Age of Onset   Hyperthyroidism Mother    Hyperthyroidism Maternal Aunt    Hyperthyroidism Maternal Grandmother    PE: BP 128/80 (BP Location: Right Arm, Patient Position: Sitting, Cuff Size: Normal)    Pulse 88    Ht 5\' 10"  (1.778 m)    Wt 155 lb 12.8 oz (70.7 kg)    BMI 22.35 kg/m  Wt Readings from Last 3 Encounters:  09/22/21 155 lb 12.8 oz (70.7 kg)  03/01/21 146 lb 3.2 oz (66.3 kg)  10/06/20 153 lb (69.4 kg)   Constitutional: normal weight, in NAD Eyes: PERRLA, EOMI, no exophthalmos ENT: moist mucous membranes, no thyromegaly, no cervical lymphadenopathy Cardiovascular: RRR, No MRG Respiratory: CTA B Musculoskeletal: no deformities, strength intact in all 4 Skin: moist, warm, no rashes Neurological: no tremor with outstretched hands, DTR normal in all 4  ASSESSMENT: 1.  Graves' disease  2.  Noncompliance with the recommended plan  PLAN:  1. Patient with history of thyrotoxicosis diagnosed in 2021, when she presented with weight loss, palpitations, tremors, shortness of breath and she was found to have a goiter.  She was initially started on Inderal and methimazole, but had RAI treatment in 06/2020.  She felt  better after this but the TSH returned high so she was started on levothyroxine 100 mcg daily.  She did not tolerate this well, with return of her pretreatment symptoms.  At that time, she stopped levothyroxine.  Another TSH obtained in 10/2020 was suppressed. -I first saw the patient 6.5 months ago, when she was off levothyroxine.  She continues to have weight loss (lost 11 pounds in 6 months), also palpitations (heart rate was 123 on arrival), e.g., anxiety, insomnia. -At that time, we discussed that it was possible that her RAI treatment did not work and may need retreatment with RAI or treatment with methimazole.  TFTs returned suppressed at that time so we ended up starting methimazole 5 mg twice a  day.  I also recommended to restart Inderal 20 mg up to 3 times a day.  I suggested to come back for labs in a month, but she did not return. -At today's visit, she tells me that she started on methimazole but she developed itching and anxiety and stopped.  She then restarted the medication but then came off again 2 weeks ago.  She is also off Inderal -will not restart this today since she is not tachycardic or tremulous.  Of note, she is also on anxiety medication and is not sure whether this may contribute to her symptoms.  Also, we discussed that thyrotoxicosis in itself can cause the symptoms, and not necessarily methimazole. -At this visit, we discussed again about the possibility of RAI treatment or continuing methimazole.  I did suggest RAI treatment but for now she would want to give methimazole another try.  To help with compliance, I advised her to take the 10 mg daily methimazole in 1 dose in the morning.  She agrees to do that. -We will recheck her TFTs today: TSH, free T4, free T3.  After the results are back, we may need to change the recommended methimazole dose. -I will advise him to come back for another visit in 3 months  2. Noncompliance with the recommended treatment plan -At this visit,  patient returns after a longer period of time, without having had labs since last visit, as recommended.  Moreover, she was off and on methimazole. -At this visit, we discussed about possible consequences of uncontrolled thyrotoxicosis to include strokes/heart attacks/sudden death -She definitely agrees to get this under control and return for labs in 4 to 5 weeks  Component     Latest Ref Rng & Units 09/22/2021  TSH     0.35 - 5.50 uIU/mL 0.12 (L)  T4,Free(Direct)     0.60 - 1.60 ng/dL 1.02 (L)  Triiodothyronine,Free,Serum     2.3 - 4.2 pg/mL 2.9  After the above results returned, I advised the patient to only start 5 mg of methimazole daily. Will plan To repeat the labs in 4 to 5 weeks.  Carlus Pavlov, MD PhD [facility]ocr[facility]

## 2021-09-23 ENCOUNTER — Encounter: Payer: Self-pay | Admitting: Internal Medicine

## 2021-09-27 ENCOUNTER — Encounter: Payer: Self-pay | Admitting: Internal Medicine

## 2021-09-27 MED ORDER — METHIMAZOLE 5 MG PO TABS
5.0000 mg | ORAL_TABLET | Freq: Every day | ORAL | 3 refills | Status: DC
Start: 2021-09-27 — End: 2022-03-31

## 2021-11-22 ENCOUNTER — Emergency Department (HOSPITAL_COMMUNITY): Admission: EM | Admit: 2021-11-22 | Payer: Self-pay | Source: Home / Self Care

## 2021-11-22 NOTE — ED Notes (Signed)
Patient left.

## 2021-12-30 ENCOUNTER — Ambulatory Visit: Payer: Self-pay | Admitting: Internal Medicine

## 2022-01-16 ENCOUNTER — Emergency Department (HOSPITAL_COMMUNITY)
Admission: EM | Admit: 2022-01-16 | Discharge: 2022-01-16 | Disposition: A | Discharge status: Home / Self Care | Payer: Managed Care, Other (non HMO) | Attending: Emergency Medicine | Admitting: Emergency Medicine

## 2022-01-16 ENCOUNTER — Other Ambulatory Visit: Payer: Self-pay

## 2022-01-16 ENCOUNTER — Encounter (HOSPITAL_COMMUNITY): Payer: Self-pay | Admitting: Emergency Medicine

## 2022-01-16 DIAGNOSIS — R112 Nausea with vomiting, unspecified: Secondary | ICD-10-CM | POA: Insufficient documentation

## 2022-01-16 DIAGNOSIS — Z5321 Procedure and treatment not carried out due to patient leaving prior to being seen by health care provider: Secondary | ICD-10-CM | POA: Diagnosis not present

## 2022-01-16 DIAGNOSIS — R1030 Lower abdominal pain, unspecified: Secondary | ICD-10-CM | POA: Diagnosis not present

## 2022-01-16 DIAGNOSIS — R197 Diarrhea, unspecified: Secondary | ICD-10-CM | POA: Diagnosis present

## 2022-01-16 LAB — CBC
HCT: 28.2 % — ABNORMAL LOW (ref 36.0–46.0)
Hemoglobin: 8.2 g/dL — ABNORMAL LOW (ref 12.0–15.0)
MCH: 21.2 pg — ABNORMAL LOW (ref 26.0–34.0)
MCHC: 29.1 g/dL — ABNORMAL LOW (ref 30.0–36.0)
MCV: 72.9 fL — ABNORMAL LOW (ref 80.0–100.0)
Platelets: 329 10*3/uL (ref 150–400)
RBC: 3.87 MIL/uL (ref 3.87–5.11)
RDW: 17.3 % — ABNORMAL HIGH (ref 11.5–15.5)
WBC: 8.4 10*3/uL (ref 4.0–10.5)
nRBC: 0 % (ref 0.0–0.2)

## 2022-01-16 LAB — COMPREHENSIVE METABOLIC PANEL
ALT: 12 U/L (ref 0–44)
AST: 19 U/L (ref 15–41)
Albumin: 4.2 g/dL (ref 3.5–5.0)
Alkaline Phosphatase: 75 U/L (ref 38–126)
Anion gap: 7 (ref 5–15)
BUN: <5 mg/dL — ABNORMAL LOW (ref 6–20)
CO2: 23 mmol/L (ref 22–32)
Calcium: 8.8 mg/dL — ABNORMAL LOW (ref 8.9–10.3)
Chloride: 108 mmol/L (ref 98–111)
Creatinine, Ser: 0.89 mg/dL (ref 0.44–1.00)
GFR, Estimated: >60 mL/min (ref >60)
Glucose, Bld: 97 mg/dL (ref 70–99)
Potassium: 3.5 mmol/L (ref 3.5–5.1)
Sodium: 138 mmol/L (ref 135–145)
Total Bilirubin: 0.9 mg/dL (ref 0.3–1.2)
Total Protein: 7.5 g/dL (ref 6.5–8.1)

## 2022-01-16 LAB — URINALYSIS, ROUTINE W REFLEX MICROSCOPIC
Bilirubin Urine: NEGATIVE
Glucose, UA: NEGATIVE mg/dL
Hgb urine dipstick: NEGATIVE
Ketones, ur: 5 mg/dL — AB
Leukocytes,Ua: NEGATIVE
Nitrite: NEGATIVE
Protein, ur: NEGATIVE mg/dL
Specific Gravity, Urine: 1.019 (ref 1.005–1.030)
pH: 6 (ref 5.0–8.0)

## 2022-01-16 LAB — LIPASE, BLOOD: Lipase: 29 U/L (ref 11–51)

## 2022-01-16 LAB — I-STAT BETA HCG BLOOD, ED (MC, WL, AP ONLY): I-stat hCG, quantitative: 12.6 m[IU]/mL — ABNORMAL HIGH (ref <5)

## 2022-01-16 NOTE — ED Triage Notes (Signed)
Pt reports loose bloody stools, intermittent nausea, vomiting, and lower abd pain x 1 week. ?

## 2022-01-24 ENCOUNTER — Other Ambulatory Visit: Payer: Self-pay

## 2022-01-24 ENCOUNTER — Encounter (HOSPITAL_COMMUNITY): Payer: Self-pay

## 2022-01-24 ENCOUNTER — Inpatient Hospital Stay (HOSPITAL_COMMUNITY)
Admission: EM | Admit: 2022-01-24 | Discharge: 2022-01-30 | DRG: 330 | Disposition: A | Discharge status: Home / Self Care | Payer: Managed Care, Other (non HMO) | Attending: General Surgery | Admitting: General Surgery

## 2022-01-24 ENCOUNTER — Emergency Department (HOSPITAL_COMMUNITY): Payer: Managed Care, Other (non HMO)

## 2022-01-24 DIAGNOSIS — E0500 Thyrotoxicosis with diffuse goiter without thyrotoxic crisis or storm: Secondary | ICD-10-CM | POA: Diagnosis present

## 2022-01-24 DIAGNOSIS — Z8349 Family history of other endocrine, nutritional and metabolic diseases: Secondary | ICD-10-CM | POA: Diagnosis not present

## 2022-01-24 DIAGNOSIS — K6389 Other specified diseases of intestine: Secondary | ICD-10-CM | POA: Diagnosis not present

## 2022-01-24 DIAGNOSIS — D649 Anemia, unspecified: Secondary | ICD-10-CM | POA: Diagnosis not present

## 2022-01-24 DIAGNOSIS — K635 Polyp of colon: Secondary | ICD-10-CM | POA: Diagnosis present

## 2022-01-24 DIAGNOSIS — K561 Intussusception: Secondary | ICD-10-CM | POA: Diagnosis present

## 2022-01-24 DIAGNOSIS — K921 Melena: Secondary | ICD-10-CM | POA: Diagnosis present

## 2022-01-24 DIAGNOSIS — E440 Moderate protein-calorie malnutrition: Secondary | ICD-10-CM | POA: Insufficient documentation

## 2022-01-24 DIAGNOSIS — E0590 Thyrotoxicosis, unspecified without thyrotoxic crisis or storm: Secondary | ICD-10-CM | POA: Diagnosis not present

## 2022-01-24 LAB — BASIC METABOLIC PANEL
Anion gap: 7 (ref 5–15)
BUN: 10 mg/dL (ref 6–20)
CO2: 24 mmol/L (ref 22–32)
Calcium: 9.3 mg/dL (ref 8.9–10.3)
Chloride: 107 mmol/L (ref 98–111)
Creatinine, Ser: 0.71 mg/dL (ref 0.44–1.00)
GFR, Estimated: >60 mL/min (ref >60)
Glucose, Bld: 111 mg/dL — ABNORMAL HIGH (ref 70–99)
Potassium: 3.4 mmol/L — ABNORMAL LOW (ref 3.5–5.1)
Sodium: 138 mmol/L (ref 135–145)

## 2022-01-24 LAB — CBC
HCT: 28.1 % — ABNORMAL LOW (ref 36.0–46.0)
Hemoglobin: 8.5 g/dL — ABNORMAL LOW (ref 12.0–15.0)
MCH: 21.8 pg — ABNORMAL LOW (ref 26.0–34.0)
MCHC: 30.2 g/dL (ref 30.0–36.0)
MCV: 72.1 fL — ABNORMAL LOW (ref 80.0–100.0)
Platelets: 340 10*3/uL (ref 150–400)
RBC: 3.9 MIL/uL (ref 3.87–5.11)
RDW: 17.4 % — ABNORMAL HIGH (ref 11.5–15.5)
WBC: 11.2 10*3/uL — ABNORMAL HIGH (ref 4.0–10.5)
nRBC: 0 % (ref 0.0–0.2)

## 2022-01-24 LAB — SAMPLE TO BLOOD BANK

## 2022-01-24 LAB — HCG, QUANTITATIVE, PREGNANCY: hCG, Beta Chain, Quant, S: <1 m[IU]/mL (ref <5)

## 2022-01-24 LAB — TSH: TSH: 2.642 u[IU]/mL (ref 0.350–4.500)

## 2022-01-24 MED ORDER — IOHEXOL 300 MG/ML  SOLN
100.0000 mL | Freq: Once | INTRAMUSCULAR | Status: AC | PRN
  Administered 2022-01-24: 100 mL via INTRAVENOUS

## 2022-01-24 NOTE — ED Provider Notes (Signed)
?[facility]-EMERGENCY DEPT ?Provider Note ? ? ?CSN: [id number] ?Arrival date & time: 01/24/22  1704 ? ?  ? ?History ? ?Chief Complaint  ?Patient presents with  ? Blood In Stools  ? ? ?Rayleen Wyrick is a 31 y.o. female. ? ?HPI ?Patient's been having liquid stool and blood for around a week now.  Went to the ER about 8 days ago, but was not actually seen.  However did have a hemoglobin of 8.  Has continued to have some blood with the diarrhea.  However now is developed pain.  Severe.  Pain for last couple days.  Does not feel lightheadedness or dizzy.  No fevers or chills. ?  ? ?Home Medications ?Prior to Admission medications   ?Medication Sig Start Date End Date Taking? Authorizing Provider  ?hydrOXYzine (ATARAX/VISTARIL) 25 MG tablet TAKE ONE HALF OR ONE WHOLE TABLET BY MOUTH THREE TIMES A DAY AS NEEDED FOR ANXIETY 08/19/21   Claiborne Rigg, NP  ?methimazole (TAPAZOLE) 5 MG tablet Take 1 tablet (5 mg total) by mouth daily. 09/27/21   Carlus Pavlov, MD  ?   ? ?Allergies    ?Patient has no known allergies.   ? ?Review of Systems   ?Review of Systems  ?Constitutional:  Negative for fever.  ?Respiratory:  Negative for shortness of breath.   ?Gastrointestinal:  Positive for abdominal pain, blood in stool and diarrhea. Negative for vomiting.  ?Musculoskeletal:  Negative for back pain.  ?Neurological:  Negative for weakness and light-headedness.  ? ?Physical Exam ?Updated Vital Signs ?BP 124/86   Pulse 84   Temp 98.9 ?F (37.2 ?C) (Oral)   Resp 18   Ht 5\' 10"  (1.778 m)   Wt 59.9 kg   LMP 12/31/2021   SpO2 100%   BMI 18.96 kg/m?  ?Physical Exam ?Vitals and nursing note reviewed.  ?HENT:  ?   Head: Atraumatic.  ?Musculoskeletal:  ?   Cervical back: Neck supple.  ?Neurological:  ?   Mental Status: She is alert.  ? ? ?ED Results / Procedures / Treatments   ?Labs ?(all labs ordered are listed, but only abnormal results are displayed) ?Labs Reviewed  ?CBC - Abnormal; Notable for the following  components:  ?    Result Value  ? WBC 11.2 (*)   ? Hemoglobin 8.5 (*)   ? HCT 28.1 (*)   ? MCV 72.1 (*)   ? MCH 21.8 (*)   ? RDW 17.4 (*)   ? All other components within normal limits  ?BASIC METABOLIC PANEL - Abnormal; Notable for the following components:  ? Potassium 3.4 (*)   ? Glucose, Bld 111 (*)   ? All other components within normal limits  ?HCG, QUANTITATIVE, PREGNANCY  ?TSH  ?POC OCCULT BLOOD, ED  ?SAMPLE TO BLOOD BANK  ? ? ?EKG ?None ? ?Radiology ?CT ABDOMEN PELVIS W CONTRAST ? ?Result Date: 01/24/2022 ?CLINICAL DATA:  Left lower quadrant abdominal pain, liquid stool with bright red blood. EXAM: CT ABDOMEN AND PELVIS WITH CONTRAST TECHNIQUE: Multidetector CT imaging of the abdomen and pelvis was performed using the standard protocol following bolus administration of intravenous contrast. RADIATION DOSE REDUCTION: This exam was performed according to the departmental dose-optimization program which includes automated exposure control, adjustment of the mA and/or kV according to patient size and/or use of iterative reconstruction technique. CONTRAST:  01/26/2022 OMNIPAQUE IOHEXOL 300 MG/ML  SOLN COMPARISON:  None. FINDINGS: Lower chest: No acute abnormality. Hepatobiliary: 5 mm cyst is present in the posterior right lobe  of the liver. The gallbladder is without stones. No biliary ductal dilatation. Pancreas: Unremarkable. No pancreatic ductal dilatation or surrounding inflammatory changes. Spleen: Normal in size without focal abnormality. Adrenals/Urinary Tract: No renal nodule or mass. No renal calculus or hydronephrosis. A subcentimeter hypodensity is present in the upper pole the left kidney which is too small to further characterize. The bladder is unremarkable. Stomach/Bowel: Stomach is within normal limits. The appendix is not visualized on exam. There is a long segment of intussusception involving the rectosigmoid colon with invagination of mesenteric fat and vasculature. No bowel obstruction, free air or  pneumatosis is seen. Evaluation of the invaginated bowel wall is limited due to nondistention. Vascular/Lymphatic: No significant vascular findings are present. No enlarged abdominal or pelvic lymph nodes. Reproductive: Uterus and bilateral adnexa are unremarkable. Other: No abdominal wall hernia or abnormality. No abdominopelvic ascites. Musculoskeletal: No acute or suspicious osseous abnormality. IMPRESSION: Long segment of intussusception involving the rectosigmoid colon with invagination of mesentery and vasculature. No bowel obstruction or free air. Surgical consultation is recommended. Critical findings reported to Dr. Rubin Payor at 8:35 p.m. Electronically Signed   By: Thornell Sartorius M.D.   On: 01/24/2022 20:35   ? ?Procedures ?Procedures  ? ? ?Medications Ordered in ED ?Medications  ?iohexol (OMNIPAQUE) 300 MG/ML solution 100 mL (100 mLs Intravenous Contrast Given 01/24/22 2015)  ? ? ?ED Course/ Medical Decision Making/ A&P ?  ?                        ?Medical Decision Making ?Amount and/or Complexity of Data Reviewed ?Labs: ordered. ?Radiology: ordered. ? ?Risk ?Prescription drug management. ?Decision regarding hospitalization. ? ? ?Patient presents with diarrhea and bloody stool.  Has had for about a week.  However pain recently developed.  Lower abdominal pain.  White count mildly elevated 11.  Hemoglobin roughly stable at 8-1/2.  Although this is down from prior.  Vitals reassuring.  CT scan done and independently interpreted and also agree with radiology read that there is a colonic intussusception.  Discussed with general surgery, Dr. Donell Beers.  She stated that she was going to review the scan but thought she discuss with gastroenterology.  I discussed with Dr. Marca Ancona from [facility] gastroenterology who stated this is a surgical issue and I need to discuss with general surgery again. ?Discussed with Dr. Donell Beers who is seeing patient ? ?Final Clinical Impression(s) / ED Diagnoses ?Final diagnoses:   ?Intussusception of colon (HCC)  ? ? ?Rx / DC Orders ?ED Discharge Orders   ? ? None  ? ?  ? ? ?  ?Benjiman Core, MD ?01/24/22 2323 ? ?

## 2022-01-24 NOTE — H&P (Signed)
Margurette Caison is an 31 y.o. female.   ?Chief Complaint:  abdominal pain. GI bleed ?HPI:  ?Pt is a 31 yo F who presents to the ED today with 2 week of diarrhea and bloody stools.  She was seen in triage at the ED last week, but did not get seen.  She had a Hgb of 8.  Denies fever/chills.  Wasn't having severe pain prior to today.  However, she did typically stop eating early due to discomfort.   ?She is unaware of any family history of GI issues or cancers.  She has never had symptoms like this before.  She has had negative HIV test as well.  No night sweats.  No known sick contacts She feels bloated.  She also feels like sometimes the waves of pain "travel into her butt."   ? ?CT shows colonic intussusception.   ? ?Past Medical History:  ?Diagnosis Date  ? Anxiety   ? Thyroid disease   ?Graves disease. ? ?Past Surgical History:  ?Procedure Laterality Date  ? NO PAST SURGERIES    ? ? ?Family History  ?Problem Relation Age of Onset  ? Hyperthyroidism Mother   ? Hyperthyroidism Maternal Grandmother   ? Hyperthyroidism Maternal Aunt   ? ?Social History:  reports that she has never smoked. She has never used smokeless tobacco. She reports current alcohol use. She reports current drug use. Drug: Marijuana. ? ?Allergies: No Known Allergies ? ?Meds: ?methimizole ? ? ?Results for orders placed or performed during the hospital encounter of 01/24/22 (from the past 48 hour(s))  ?CBC     Status: Abnormal  ? Collection Time: 01/24/22  6:30 PM  ?Result Value Ref Range  ? WBC 11.2 (H) 4.0 - 10.5 K/uL  ? RBC 3.90 3.87 - 5.11 MIL/uL  ? Hemoglobin 8.5 (L) 12.0 - 15.0 g/dL  ?  Comment: Reticulocyte Hemoglobin testing ?may be clinically indicated, ?consider ordering this additional ?test [id number] ?  ? HCT 28.1 (L) 36.0 - 46.0 %  ? MCV 72.1 (L) 80.0 - 100.0 fL  ? MCH 21.8 (L) 26.0 - 34.0 pg  ? MCHC 30.2 30.0 - 36.0 g/dL  ? RDW 17.4 (H) 11.5 - 15.5 %  ? Platelets 340 150 - 400 K/uL  ? nRBC 0.0 0.0 - 0.2 %  ?  Comment: Performed at [facility], [place] [address], [place], [place] [address]  ?Basic metabolic panel     Status: Abnormal  ? Collection Time: 01/24/22  6:30 PM  ?Result Value Ref Range  ? Sodium 138 135 - 145 mmol/L  ? Potassium 3.4 (L) 3.5 - 5.1 mmol/L  ? Chloride 107 98 - 111 mmol/L  ? CO2 24 22 - 32 mmol/L  ? Glucose, Bld 111 (H) 70 - 99 mg/dL  ?  Comment: Glucose reference range applies only to samples taken after fasting for at least 8 hours.  ? BUN 10 6 - 20 mg/dL  ? Creatinine, Ser 0.71 0.44 - 1.00 mg/dL  ? Calcium 9.3 8.9 - 10.3 mg/dL  ? GFR, Estimated >60 >60 mL/min  ?  Comment: (NOTE) ?Calculated using the CKD-EPI Creatinine Equation (2021) ?  ? Anion gap 7 5 - 15  ?  Comment: Performed at [facility], [place] [address], [place], [place] [address]  ?hCG, quantitative, pregnancy     Status: None  ? Collection Time: 01/24/22  6:30 PM  ?Result Value Ref Range  ? hCG, Beta Chain, Quant, S <1 <5 mIU/mL  ?  Comment:        ?  GEST. AGE      CONC.  (mIU/mL) ?  <=1 WEEK        5 - 50 ?    2 WEEKS       50 - 500 ?    3 WEEKS       100 - 10,000 ?    4 WEEKS     1,000 - 30,000 ?    5 WEEKS     3,500 - 115,000 ?  6-8 WEEKS     12,000 - 270,000 ?   12 WEEKS     15,000 - 220,000 ?       ?FEMALE AND NON-PREGNANT FEMALE: ?    LESS THAN 5 mIU/mL ?Performed at [facility], [place] [address], [place], [place] [address] ?  ?Sample to Blood Bank     Status: None  ? Collection Time: 01/24/22  6:30 PM  ?Result Value Ref Range  ? Blood Bank Specimen SAMPLE AVAILABLE FOR TESTING   ? Sample Expiration    ?  01/27/2022,2359 ?Performed at [facility], [place] [address] [place], [place] [address] ?  ?TSH     Status: None  ? Collection Time: 01/24/22  6:30 PM  ?Result Value Ref Range  ? TSH 2.642 0.350 - 4.500 uIU/mL  ?  Comment: Performed by a 3rd Generation assay with a functional sensitivity of <=0.01 uIU/mL. ?Performed at [facility], [place] [address] [place],   [address] ?  ? ?CT ABDOMEN PELVIS W CONTRAST ? ?Result Date: 01/24/2022 ?CLINICAL DATA:  Left lower quadrant abdominal pain, liquid stool with bright red blood. EXAM: CT ABDOMEN AND PELVIS WITH CONTRAST TECHNIQUE: Multidetector CT imaging of the abdomen and pelvis was performed using the standard protocol following bolus administration of intravenous contrast. RADIATION DOSE REDUCTION: This exam was performed according to the departmental dose-optimization program which includes automated exposure control, adjustment of the mA and/or kV according to patient size and/or use of iterative reconstruction technique. CONTRAST:  146mL OMNIPAQUE IOHEXOL 300 MG/ML  SOLN COMPARISON:  None. FINDINGS: Lower chest: No acute abnormality. Hepatobiliary: 5 mm cyst is present in the posterior right lobe of the liver. The gallbladder is without stones. No biliary ductal dilatation. Pancreas: Unremarkable. No pancreatic ductal dilatation or surrounding inflammatory changes. Spleen: Normal in size without focal abnormality. Adrenals/Urinary Tract: No renal nodule or mass. No renal calculus or hydronephrosis. A subcentimeter hypodensity is present in the upper pole the left kidney which is too small to further characterize. The bladder is unremarkable. Stomach/Bowel: Stomach is within normal limits. The appendix is not visualized on exam. There is a long segment of intussusception involving the rectosigmoid colon with invagination of mesenteric fat and vasculature. No bowel obstruction, free air or pneumatosis is seen. Evaluation of the invaginated bowel wall is limited due to nondistention. Vascular/Lymphatic: No significant vascular findings are present. No enlarged abdominal or pelvic lymph nodes. Reproductive: Uterus and bilateral adnexa are unremarkable. Other: No abdominal wall hernia or abnormality. No abdominopelvic ascites. Musculoskeletal: No acute or suspicious osseous abnormality. IMPRESSION: Long segment of intussusception  involving the rectosigmoid colon with invagination of mesentery and vasculature. No bowel obstruction or free air. Surgical consultation is recommended. Critical findings reported to Dr. Alvino Chapel at 8:35 p.m. Electronically Signed   By: Brett Fairy M.D.   On: 01/24/2022 20:35   ? ?Review of Systems  ?Constitutional:  Positive for chills.  ?HENT: Negative.    ?  Eyes: Negative.   ?Respiratory: Negative.    ?Cardiovascular: Negative.   ?Gastrointestinal:  Positive for abdominal distention, abdominal pain, blood in stool, diarrhea and nausea.  ?Endocrine: Negative.   ?Genitourinary: Negative.   ?Musculoskeletal: Negative.   ?Allergic/Immunologic: Negative.   ?Neurological: Negative.   ?Hematological: Negative.   ?Psychiatric/Behavioral: Negative.    ?All other systems reviewed and are negative. ? ? ?Blood pressure 124/86, pulse 84, temperature 98.9 ?F (37.2 ?C), temperature source Oral, resp. rate 18, height 5\' 10"  (1.778 m), weight 59.9 kg, last menstrual period 12/31/2021, SpO2 100 %. ?Physical Exam ?Vitals reviewed.  ?Constitutional:   ?   General: She is in acute distress (looks uncomfortable.).  ?   Appearance: Normal appearance. She is not ill-appearing.  ?HENT:  ?   Head: Normocephalic and atraumatic.  ?   Right Ear: External ear normal.  ?   Left Ear: External ear normal.  ?   Nose: Nose normal.  ?   Mouth/Throat:  ?   Mouth: Mucous membranes are moist.  ?Eyes:  ?   General: No scleral icterus.    ?   Right eye: No discharge.     ?   Left eye: No discharge.  ?   Extraocular Movements: Extraocular movements intact.  ?   Conjunctiva/sclera: Conjunctivae normal.  ?   Pupils: Pupils are equal, round, and reactive to light.  ?Cardiovascular:  ?   Rate and Rhythm: Normal rate and regular rhythm.  ?   Pulses: Normal pulses.  ?   Heart sounds: Normal heart sounds. No murmur heard. ?  No gallop.  ?Pulmonary:  ?   Effort: Pulmonary effort is normal. No respiratory distress.  ?   Breath sounds: Normal breath sounds. No  stridor. No wheezing or rhonchi.  ?Abdominal:  ?   General: There is distension (mildly distended).  ?   Palpations: Abdomen is soft. There is no mass.  ?   Tenderness: Tenderness: mild LLQ tenderness. Ther

## 2022-01-24 NOTE — ED Provider Triage Note (Signed)
Emergency Medicine Provider Triage Evaluation Note ? ?Alexandria Frank , a 31 y.o. female  was evaluated in triage.  Pt complains of blood in stool for 3 weeks.  Says it turns the toilet bowl red.  Also endorsing pain with defecation.  She is unsure if it is hemorrhoids however denies a history of IBS/IBD or colorectal cancer ? ?Review of Systems  ?Positive: Blood in stool ?Negative: Dizziness, syncope, shortness of breath or palpitations ? ?Physical Exam  ?BP (!) 142/90 (BP Location: Left Arm)   Pulse 94   Temp 98.9 ?F (37.2 ?C) (Oral)   Resp 18   Ht 5\' 10"  (1.778 m)   Wt 59.9 kg   LMP 12/31/2021   SpO2 100%   BMI 18.96 kg/m?  ?Gen:   Awake, no distress   ?Resp:  Normal effort  ?MSK:   Moves extremities without difficulty  ?Other:   ? ?Medical Decision Making  ?Medically screening exam initiated at 5:51 PM.  Appropriate orders placed.  Alexandria Frank was informed that the remainder of the evaluation will be completed by another provider, this initial triage assessment does not replace that evaluation, and the importance of remaining in the ED until their evaluation is complete. ? ? ?  ?Alexandria Palmer, PA-C ?01/24/22 1752 ? ?

## 2022-01-24 NOTE — ED Triage Notes (Signed)
Patient reports that she has had liquid stool with bright red blood. Patient reports that [facility] and states lower abdominal pain has worsened. ?

## 2022-01-25 LAB — SURGICAL PCR SCREEN
MRSA, PCR: NEGATIVE
Staphylococcus aureus: NEGATIVE

## 2022-01-25 LAB — CBC
HCT: 26 % — ABNORMAL LOW (ref 36.0–46.0)
Hemoglobin: 7.8 g/dL — ABNORMAL LOW (ref 12.0–15.0)
MCH: 21.6 pg — ABNORMAL LOW (ref 26.0–34.0)
MCHC: 30 g/dL (ref 30.0–36.0)
MCV: 72 fL — ABNORMAL LOW (ref 80.0–100.0)
Platelets: 302 10*3/uL (ref 150–400)
RBC: 3.61 MIL/uL — ABNORMAL LOW (ref 3.87–5.11)
RDW: 17.3 % — ABNORMAL HIGH (ref 11.5–15.5)
WBC: 10.1 10*3/uL (ref 4.0–10.5)
nRBC: 0 % (ref 0.0–0.2)

## 2022-01-25 LAB — BASIC METABOLIC PANEL
Anion gap: 5 (ref 5–15)
BUN: 7 mg/dL (ref 6–20)
CO2: 23 mmol/L (ref 22–32)
Calcium: 8.8 mg/dL — ABNORMAL LOW (ref 8.9–10.3)
Chloride: 109 mmol/L (ref 98–111)
Creatinine, Ser: 0.62 mg/dL (ref 0.44–1.00)
GFR, Estimated: >60 mL/min (ref >60)
Glucose, Bld: 90 mg/dL (ref 70–99)
Potassium: 3.6 mmol/L (ref 3.5–5.1)
Sodium: 137 mmol/L (ref 135–145)

## 2022-01-25 LAB — HIV ANTIBODY (ROUTINE TESTING W REFLEX): HIV Screen 4th Generation wRfx: NONREACTIVE

## 2022-01-25 MED ORDER — CHLORHEXIDINE GLUCONATE CLOTH 2 % EX PADS
6.0000 | MEDICATED_PAD | Freq: Once | CUTANEOUS | Status: AC
  Administered 2022-01-25: 6 via TOPICAL

## 2022-01-25 MED ORDER — MELATONIN 3 MG PO TABS: 3.0000 mg | ORAL_TABLET | Freq: Every evening | ORAL | Status: DC | PRN

## 2022-01-25 MED ORDER — ENSURE PRE-SURGERY PO LIQD
296.0000 mL | Freq: Once | ORAL | Status: AC
  Administered 2022-01-26: 296 mL via ORAL
  Filled 2022-01-25: qty 296

## 2022-01-25 MED ORDER — METRONIDAZOLE 500 MG PO TABS
1000.0000 mg | ORAL_TABLET | ORAL | Status: DC
  Administered 2022-01-25 (×2): 1000 mg via ORAL
  Filled 2022-01-25 (×2): qty 2

## 2022-01-25 MED ORDER — POLYETHYLENE GLYCOL 3350 17 GM/SCOOP PO POWD
0.5000 | Freq: Once | ORAL | Status: AC
  Administered 2022-01-25: 127.5 g via ORAL
  Filled 2022-01-25: qty 255

## 2022-01-25 MED ORDER — ALVIMOPAN 12 MG PO CAPS
12.0000 mg | ORAL_CAPSULE | ORAL | Status: AC
  Administered 2022-01-26: 12 mg via ORAL
  Filled 2022-01-25: qty 1

## 2022-01-25 MED ORDER — HEPARIN SODIUM (PORCINE) 5000 UNIT/ML IJ SOLN
5000.0000 [IU] | INTRAMUSCULAR | Status: AC
  Administered 2022-01-26: 5000 [IU] via SUBCUTANEOUS
  Filled 2022-01-25: qty 1

## 2022-01-25 MED ORDER — METHOCARBAMOL 1000 MG/10ML IJ SOLN
500.0000 mg | Freq: Three times a day (TID) | INTRAVENOUS | Status: DC | PRN
  Filled 2022-01-25: qty 5

## 2022-01-25 MED ORDER — ONDANSETRON 4 MG PO TBDP: 4.0000 mg | ORAL_TABLET | Freq: Four times a day (QID) | ORAL | Status: DC | PRN

## 2022-01-25 MED ORDER — PROCHLORPERAZINE EDISYLATE 10 MG/2ML IJ SOLN
5.0000 mg | Freq: Four times a day (QID) | INTRAMUSCULAR | Status: DC | PRN
  Administered 2022-01-25: 10 mg via INTRAVENOUS
  Filled 2022-01-25: qty 2

## 2022-01-25 MED ORDER — NEOMYCIN SULFATE 500 MG PO TABS
1000.0000 mg | ORAL_TABLET | ORAL | Status: DC
  Administered 2022-01-25 (×2): 1000 mg via ORAL
  Filled 2022-01-25 (×3): qty 2

## 2022-01-25 MED ORDER — ACETAMINOPHEN 500 MG PO TABS
1000.0000 mg | ORAL_TABLET | ORAL | Status: AC
  Administered 2022-01-26: 1000 mg via ORAL
  Filled 2022-01-25: qty 2

## 2022-01-25 MED ORDER — HYDROXYZINE HCL 25 MG PO TABS: 25.0000 mg | ORAL_TABLET | Freq: Three times a day (TID) | ORAL | Status: DC | PRN

## 2022-01-25 MED ORDER — MORPHINE SULFATE (PF) 2 MG/ML IV SOLN
1.0000 mg | INTRAVENOUS | Status: DC | PRN
  Administered 2022-01-25: 2 mg via INTRAVENOUS
  Filled 2022-01-25: qty 1

## 2022-01-25 MED ORDER — ACETAMINOPHEN 325 MG PO TABS: 650.0000 mg | ORAL_TABLET | Freq: Four times a day (QID) | ORAL | Status: DC | PRN

## 2022-01-25 MED ORDER — METHIMAZOLE 5 MG PO TABS
5.0000 mg | ORAL_TABLET | Freq: Every day | ORAL | Status: DC
  Administered 2022-01-25 – 2022-01-30 (×5): 5 mg via ORAL
  Filled 2022-01-25 (×6): qty 1

## 2022-01-25 MED ORDER — METHOCARBAMOL 500 MG PO TABS
500.0000 mg | ORAL_TABLET | Freq: Three times a day (TID) | ORAL | Status: DC | PRN
  Administered 2022-01-25 – 2022-01-29 (×3): 500 mg via ORAL
  Filled 2022-01-25 (×3): qty 1

## 2022-01-25 MED ORDER — ENSURE PRE-SURGERY PO LIQD
592.0000 mL | Freq: Once | ORAL | Status: DC
  Filled 2022-01-25: qty 592

## 2022-01-25 MED ORDER — PROCHLORPERAZINE MALEATE 10 MG PO TABS
10.0000 mg | ORAL_TABLET | Freq: Four times a day (QID) | ORAL | Status: DC | PRN
  Filled 2022-01-25: qty 1

## 2022-01-25 MED ORDER — ACETAMINOPHEN 650 MG RE SUPP: 650.0000 mg | Freq: Four times a day (QID) | RECTAL | Status: DC | PRN

## 2022-01-25 MED ORDER — KCL IN DEXTROSE-NACL 20-5-0.45 MEQ/L-%-% IV SOLN
INTRAVENOUS | Status: DC
  Filled 2022-01-25 (×9): qty 1000

## 2022-01-25 MED ORDER — ONDANSETRON HCL 4 MG/2ML IJ SOLN
4.0000 mg | Freq: Four times a day (QID) | INTRAMUSCULAR | Status: DC | PRN
  Administered 2022-01-25 – 2022-01-26 (×2): 4 mg via INTRAVENOUS
  Filled 2022-01-25 (×2): qty 2

## 2022-01-25 MED ORDER — SODIUM CHLORIDE 0.9 % IV SOLN
2.0000 g | INTRAVENOUS | Status: AC
  Administered 2022-01-26: 2 g via INTRAVENOUS
  Filled 2022-01-25: qty 2

## 2022-01-25 NOTE — Progress Notes (Signed)
Dr Marlou Starks notified that pt has started throwing up her bowel prep. Prn med given and will cont to monitor ?

## 2022-01-25 NOTE — Progress Notes (Signed)
Initial Nutrition Assessment ? ?DOCUMENTATION CODES:  ? ?Non-severe (moderate) malnutrition in context of acute illness/injury ? ?INTERVENTION:  ? ?Once diet advanced: ?-Ensure Plus High Protein po BID, each supplement provides 350 kcal and 20 grams of protein.  ? ?-Multivitamin with minerals daily ? ?NUTRITION DIAGNOSIS:  ? ?Moderate Malnutrition related to diarrhea, acute illness as evidenced by percent weight loss, energy intake < 75% for > 7 days. ? ?GOAL:  ? ?Patient will meet greater than or equal to 90% of their needs ? ?MONITOR:  ? ?PO intake, Supplement acceptance, Diet advancement, Labs, Weight trends, I & O's ? ?REASON FOR ASSESSMENT:  ? ?Malnutrition Screening Tool ?  ? ?ASSESSMENT:  ? ?31 yo F who presents to the ED today with 2 week of diarrhea and bloody stools. CT shows colonic intussusception ? ?Patient was NPO this morning. Pt now on clear liquids. Per surgery note, plan is for open sigmoid colectomy 4/27.  ? ?Pt reports she has not been eating as well for 2 weeks, having diarrhea and a few instances of N/V. Prior to symptom onset, pt reports eating well, 3 meals a day.  ?Pt states she ate a hot dog yesterday. Then had chicken broth and jello later last night. ?Pt agreeable to Ensure once diet is advanced. ? ?Per weight records, pt has lost 23 lbs since 09/22/21 (14% wt loss x 4 months, significant for time frame).  ? ?Medications: Miralax, D5 infusion ? ?Labs reviewed. ? ?NUTRITION - FOCUSED PHYSICAL EXAM: ? ?No depletions noted. ? ?Diet Order:   ?Diet Order   ? ?       ?  Diet NPO time specified Except for: [employer], Sips with Meds  Diet effective ____       ?  ?  Diet clear liquid Room service appropriate? Yes; Fluid consistency: Thin  Diet effective now       ?  ? ?  ?  ? ?  ? ? ?EDUCATION NEEDS:  ? ?No education needs have been identified at this time ? ?Skin:  Skin Assessment: Reviewed RN Assessment ? ?Last BM:  PTA ? ?Height:  ? ?Ht Readings from Last 1 Encounters:  ?01/24/22 5\' 10"  (1.778  m)  ? ? ?Weight:  ? ?Wt Readings from Last 1 Encounters:  ?01/24/22 59.9 kg  ? ? ?BMI:  Body mass index is 18.96 kg/m?. ? ?Estimated Nutritional Needs:  ? ?Kcal:  1500-1700 ? ?Protein:  85-95g ? ?Fluid:  1.7L/day ? ? ?01/26/22, MS, RD, LDN ?Inpatient Clinical Dietitian ?Contact information available via Amion ? ?

## 2022-01-25 NOTE — Progress Notes (Signed)
?  Transition of Care (TOC) Screening Note ? ? ?Patient Details  ?Name: Alexandria Frank ?Date of Birth: October 28, 1990 ? ? ?Transition of Care (TOC) CM/SW Contact:    ?Chakira Jachim, LCSW ?Phone Number: ?01/25/2022, 12:39 PM ? ? ? ?Transition of Care Department [facility]) has reviewed patient and no TOC needs have been identified at this time. We will continue to monitor patient advancement through interdisciplinary progression rounds. If new patient transition needs arise, please place a TOC consult. ? ? ?

## 2022-01-25 NOTE — Progress Notes (Signed)
? ?Progress Note ? ?   ?Subjective: ?Pt reports some intermittent cramping pain but pain medication is helping and overall it is better than presentation. She reports she passed a little flatus this AM. Feeling less bloated. Wants to know if she can drink today. Discussed with patient current recommendations for surgical intervention and tentative plan for tomorrow but advised we are discussing with colorectal surgery to figure out timing as well. Grandmother and father were present in room as well.  ? ?Objective: ?Vital signs in last 24 hours: ?Temp:  [98.1 ?F (36.7 ?C)-98.9 ?F (37.2 ?C)] 98.6 ?F (37 ?C) (04/26 0826) ?Pulse Rate:  [69-94] 69 (04/26 0826) ?Resp:  [14-20] 14 (04/26 0826) ?BP: (118-142)/(76-90) 120/84 (04/26 0826) ?SpO2:  [100 %] 100 % (04/26 0826) ?Weight:  [59.9 kg] 59.9 kg (04/25 1726) ?  ? ?Intake/Output from previous day: ?04/25 0701 - 04/26 0700 ?In: 115.2 [I.V.:115.2] ?Out: -  ?Intake/Output this shift: ?No intake/output data recorded. ? ?PE: ?General: pleasant, WD, thin female who is laying in bed in NAD ?Heart: regular, rate, and rhythm.   ?Lungs: CTAB, no wheezes, rhonchi, or rales noted.  Respiratory effort nonlabored ?Abd: soft, ttp in LLQ without peritonitis, ND, +BS, no palpable masses, hernias, or organomegaly ?MS: all 4 extremities are symmetrical with no cyanosis, clubbing, or edema. ?Skin: warm and dry with no masses, lesions, or rashes ?Psych: A&Ox3 with an appropriate affect.  ? ? ?Lab Results:  ?Recent Labs  ?  01/24/22 ?1830 01/25/22 ?0435  ?WBC 11.2* 10.1  ?HGB 8.5* 7.8*  ?HCT 28.1* 26.0*  ?PLT 340 302  ? ?BMET ?Recent Labs  ?  01/24/22 ?1830 01/25/22 ?0435  ?NA 138 137  ?K 3.4* 3.6  ?CL 107 109  ?CO2 24 23  ?GLUCOSE 111* 90  ?BUN 10 7  ?CREATININE 0.71 0.62  ?CALCIUM 9.3 8.8*  ? ?PT/INR ?No results for input(s): LABPROT, INR in the last 72 hours. ?CMP  ?   ?Component Value Date/Time  ? NA 137 01/25/2022 0435  ? NA 143 07/29/2020 1505  ? K 3.6 01/25/2022 0435  ? CL 109  01/25/2022 0435  ? CO2 23 01/25/2022 0435  ? GLUCOSE 90 01/25/2022 0435  ? BUN 7 01/25/2022 0435  ? BUN 9 07/29/2020 1505  ? CREATININE 0.62 01/25/2022 0435  ? CALCIUM 8.8 (L) 01/25/2022 0435  ? PROT 7.5 01/16/2022 0800  ? PROT 7.0 07/29/2020 1505  ? ALBUMIN 4.2 01/16/2022 0800  ? ALBUMIN 4.3 07/29/2020 1505  ? AST 19 01/16/2022 0800  ? ALT 12 01/16/2022 0800  ? ALKPHOS 75 01/16/2022 0800  ? BILITOT 0.9 01/16/2022 0800  ? BILITOT 0.7 07/29/2020 1505  ? GFRNONAA >60 01/25/2022 0435  ? GFRAA 133 07/29/2020 1505  ? ?Lipase  ?   ?Component Value Date/Time  ? LIPASE 29 01/16/2022 0800  ? ? ? ? ? ?Studies/Results: ?CT ABDOMEN PELVIS W CONTRAST ? ?Result Date: 01/24/2022 ?CLINICAL DATA:  Left lower quadrant abdominal pain, liquid stool with bright red blood. EXAM: CT ABDOMEN AND PELVIS WITH CONTRAST TECHNIQUE: Multidetector CT imaging of the abdomen and pelvis was performed using the standard protocol following bolus administration of intravenous contrast. RADIATION DOSE REDUCTION: This exam was performed according to the departmental dose-optimization program which includes automated exposure control, adjustment of the mA and/or kV according to patient size and/or use of iterative reconstruction technique. CONTRAST:  100mL OMNIPAQUE IOHEXOL 300 MG/ML  SOLN COMPARISON:  None. FINDINGS: Lower chest: No acute abnormality. Hepatobiliary: 5 mm cyst is present in the   posterior right lobe of the liver. The gallbladder is without stones. No biliary ductal dilatation. Pancreas: Unremarkable. No pancreatic ductal dilatation or surrounding inflammatory changes. Spleen: Normal in size without focal abnormality. Adrenals/Urinary Tract: No renal nodule or mass. No renal calculus or hydronephrosis. A subcentimeter hypodensity is present in the upper pole the left kidney which is too small to further characterize. The bladder is unremarkable. Stomach/Bowel: Stomach is within normal limits. The appendix is not visualized on exam. There is  a long segment of intussusception involving the rectosigmoid colon with invagination of mesenteric fat and vasculature. No bowel obstruction, free air or pneumatosis is seen. Evaluation of the invaginated bowel wall is limited due to nondistention. Vascular/Lymphatic: No significant vascular findings are present. No enlarged abdominal or pelvic lymph nodes. Reproductive: Uterus and bilateral adnexa are unremarkable. Other: No abdominal wall hernia or abnormality. No abdominopelvic ascites. Musculoskeletal: No acute or suspicious osseous abnormality. IMPRESSION: Long segment of intussusception involving the rectosigmoid colon with invagination of mesentery and vasculature. No bowel obstruction or free air. Surgical consultation is recommended. Critical findings reported to Dr. Pickering at 8:35 p.m. Electronically Signed   By: Laura  Taylor M.D.   On: 01/24/2022 20:35   ? ?Anti-infectives: ?Anti-infectives (From admission, onward)  ? ? None  ? ?  ? ? ? ?Assessment/Plan ?Rectosigmoid intussusception  ?- CT AP 4/25 with long segment of intussusception at rectosigmoid ?- no reported mass or lead point or mass on CT report but tissue looks a little abnormal more distally and certainly concern for presence of something there given longer segment of intussusception  ?- discussed some with colorectal surgeon this AM and not sure flex-sig would really provide benefit if plan is to proceed with surgery regardless ?- will discuss with MDs further and determine timing of OR and whether patient can drink some fluids today +/- bowel prep ? ?FEN: NPO, IVF @75 cc/h ?VTE: SCDs ?ID: no current abx ? LOS: 1 day  ? ?I independently reviewed CT AP and agree with radiologist assessment. Reviewed CBC and BMET. Reviewed vitals and intake and output since admission.  ? ? ?Donnamaria Shands R Traniece Boffa, PA-C ?[facility] ?01/25/2022, 11:01 AM ?Please see Amion for pager number during day hours 7:00am-4:30pm ? ?

## 2022-01-25 NOTE — H&P (View-Only) (Signed)
? ?Progress Note ? ?   ?Subjective: ?Pt reports some intermittent cramping pain but pain medication is helping and overall it is better than presentation. She reports she passed a little flatus this AM. Feeling less bloated. Wants to know if she can drink today. Discussed with patient current recommendations for surgical intervention and tentative plan for tomorrow but advised we are discussing with colorectal surgery to figure out timing as well. Grandmother and father were present in room as well.  ? ?Objective: ?Vital signs in last 24 hours: ?Temp:  [98.1 ?F (36.7 ?C)-98.9 ?F (37.2 ?C)] 98.6 ?F (37 ?C) (04/26 [id number]) ?Pulse Rate:  [69-94] 69 (04/26 0826) ?Resp:  [14-20] 14 (04/26 0826) ?BP: (118-142)/(76-90) 120/84 (04/26 [id number]) ?SpO2:  [100 %] 100 % (04/26 0826) ?Weight:  [59.9 kg] 59.9 kg (04/25 1726) ?  ? ?Intake/Output from previous day: ?04/25 0701 - 04/26 0700 ?In: 115.2 [I.V.:115.2] ?Out: -  ?Intake/Output this shift: ?No intake/output data recorded. ? ?PE: ?General: pleasant, WD, thin female who is laying in bed in NAD ?Heart: regular, rate, and rhythm.   ?Lungs: CTAB, no wheezes, rhonchi, or rales noted.  Respiratory effort nonlabored ?Abd: soft, ttp in LLQ without peritonitis, ND, +BS, no palpable masses, hernias, or organomegaly ?MS: all 4 extremities are symmetrical with no cyanosis, clubbing, or edema. ?Skin: warm and dry with no masses, lesions, or rashes ?Psych: A&Ox3 with an appropriate affect.  ? ? ?Lab Results:  ?Recent Labs  ?  01/24/22 ?1830 01/25/22 ?0435  ?WBC 11.2* 10.1  ?HGB 8.5* 7.8*  ?HCT 28.1* 26.0*  ?PLT 340 302  ? ?BMET ?Recent Labs  ?  01/24/22 ?1830 01/25/22 ?0435  ?NA 138 137  ?K 3.4* 3.6  ?CL 107 109  ?CO2 24 23  ?GLUCOSE 111* 90  ?BUN 10 7  ?CREATININE 0.71 0.62  ?CALCIUM 9.3 8.8*  ? ?PT/INR ?No results for input(s): LABPROT, INR in the last 72 hours. ?CMP  ?   ?Component Value Date/Time  ? NA 137 01/25/2022 0435  ? NA 143 07/29/2020 1505  ? K 3.6 01/25/2022 0435  ? CL 109  01/25/2022 0435  ? CO2 23 01/25/2022 0435  ? GLUCOSE 90 01/25/2022 0435  ? BUN 7 01/25/2022 0435  ? BUN 9 07/29/2020 1505  ? CREATININE 0.62 01/25/2022 0435  ? CALCIUM 8.8 (L) 01/25/2022 0435  ? PROT 7.5 01/16/2022 0800  ? PROT 7.0 07/29/2020 1505  ? ALBUMIN 4.2 01/16/2022 0800  ? ALBUMIN 4.3 07/29/2020 1505  ? AST 19 01/16/2022 0800  ? ALT 12 01/16/2022 0800  ? ALKPHOS 75 01/16/2022 0800  ? BILITOT 0.9 01/16/2022 0800  ? BILITOT 0.7 07/29/2020 1505  ? GFRNONAA >60 01/25/2022 0435  ? GFRAA 133 07/29/2020 1505  ? ?Lipase  ?   ?Component Value Date/Time  ? LIPASE 29 01/16/2022 0800  ? ? ? ? ? ?Studies/Results: ?CT ABDOMEN PELVIS W CONTRAST ? ?Result Date: 01/24/2022 ?CLINICAL DATA:  Left lower quadrant abdominal pain, liquid stool with bright red blood. EXAM: CT ABDOMEN AND PELVIS WITH CONTRAST TECHNIQUE: Multidetector CT imaging of the abdomen and pelvis was performed using the standard protocol following bolus administration of intravenous contrast. RADIATION DOSE REDUCTION: This exam was performed according to the departmental dose-optimization program which includes automated exposure control, adjustment of the mA and/or kV according to patient size and/or use of iterative reconstruction technique. CONTRAST:  149mL OMNIPAQUE IOHEXOL 300 MG/ML  SOLN COMPARISON:  None. FINDINGS: Lower chest: No acute abnormality. Hepatobiliary: 5 mm cyst is present in the  posterior right lobe of the liver. The gallbladder is without stones. No biliary ductal dilatation. Pancreas: Unremarkable. No pancreatic ductal dilatation or surrounding inflammatory changes. Spleen: Normal in size without focal abnormality. Adrenals/Urinary Tract: No renal nodule or mass. No renal calculus or hydronephrosis. A subcentimeter hypodensity is present in the upper pole the left kidney which is too small to further characterize. The bladder is unremarkable. Stomach/Bowel: Stomach is within normal limits. The appendix is not visualized on exam. There is  a long segment of intussusception involving the rectosigmoid colon with invagination of mesenteric fat and vasculature. No bowel obstruction, free air or pneumatosis is seen. Evaluation of the invaginated bowel wall is limited due to nondistention. Vascular/Lymphatic: No significant vascular findings are present. No enlarged abdominal or pelvic lymph nodes. Reproductive: Uterus and bilateral adnexa are unremarkable. Other: No abdominal wall hernia or abnormality. No abdominopelvic ascites. Musculoskeletal: No acute or suspicious osseous abnormality. IMPRESSION: Long segment of intussusception involving the rectosigmoid colon with invagination of mesentery and vasculature. No bowel obstruction or free air. Surgical consultation is recommended. Critical findings reported to Dr. Alvino Chapel at 8:35 p.m. Electronically Signed   By: Brett Fairy M.D.   On: 01/24/2022 20:35   ? ?Anti-infectives: ?Anti-infectives (From admission, onward)  ? ? None  ? ?  ? ? ? ?Assessment/Plan ?Rectosigmoid intussusception  ?- CT AP 4/25 with long segment of intussusception at rectosigmoid ?- no reported mass or lead point or mass on CT report but tissue looks a little abnormal more distally and certainly concern for presence of something there given longer segment of intussusception  ?- discussed some with colorectal surgeon this AM and not sure flex-sig would really provide benefit if plan is to proceed with surgery regardless ?- will discuss with MDs further and determine timing of OR and whether patient can drink some fluids today +/- bowel prep ? ?FEN: NPO, IVF @75  cc/h ?VTE: SCDs ?ID: no current abx ? LOS: 1 day  ? ?I independently reviewed CT AP and agree with radiologist assessment. Reviewed CBC and BMET. Reviewed vitals and intake and output since admission.  ? ? ?Norm Parcel, PA-C ?[facility] ?01/25/2022, 11:01 AM ?Please see Amion for pager number during day hours 7:00am-4:30pm ? ?

## 2022-01-26 ENCOUNTER — Other Ambulatory Visit: Payer: Self-pay

## 2022-01-26 ENCOUNTER — Inpatient Hospital Stay (HOSPITAL_COMMUNITY): Payer: Managed Care, Other (non HMO) | Admitting: Anesthesiology

## 2022-01-26 ENCOUNTER — Encounter (HOSPITAL_COMMUNITY): Payer: Self-pay

## 2022-01-26 ENCOUNTER — Encounter (HOSPITAL_COMMUNITY): Admission: EM | Disposition: A | Discharge status: Home / Self Care | Payer: Self-pay | Source: Home / Self Care

## 2022-01-26 DIAGNOSIS — E440 Moderate protein-calorie malnutrition: Secondary | ICD-10-CM | POA: Insufficient documentation

## 2022-01-26 DIAGNOSIS — K561 Intussusception: Secondary | ICD-10-CM

## 2022-01-26 DIAGNOSIS — K6389 Other specified diseases of intestine: Secondary | ICD-10-CM

## 2022-01-26 DIAGNOSIS — E0590 Thyrotoxicosis, unspecified without thyrotoxic crisis or storm: Secondary | ICD-10-CM

## 2022-01-26 DIAGNOSIS — D649 Anemia, unspecified: Secondary | ICD-10-CM

## 2022-01-26 HISTORY — PX: COLECTOMY: SHX59

## 2022-01-26 SURGERY — COLECTOMY, TOTAL
Anesthesia: General | Site: Abdomen

## 2022-01-26 MED ORDER — ALVIMOPAN 12 MG PO CAPS
12.0000 mg | ORAL_CAPSULE | Freq: Two times a day (BID) | ORAL | Status: DC
  Administered 2022-01-27 – 2022-01-29 (×6): 12 mg via ORAL
  Filled 2022-01-26 (×6): qty 1

## 2022-01-26 MED ORDER — ONDANSETRON HCL 4 MG/2ML IJ SOLN
INTRAMUSCULAR | Status: AC
  Filled 2022-01-26: qty 2

## 2022-01-26 MED ORDER — SUCCINYLCHOLINE CHLORIDE 200 MG/10ML IV SOSY
PREFILLED_SYRINGE | INTRAVENOUS | Status: DC | PRN
  Administered 2022-01-26: 120 mg via INTRAVENOUS

## 2022-01-26 MED ORDER — KETOROLAC TROMETHAMINE 30 MG/ML IJ SOLN
30.0000 mg | Freq: Once | INTRAMUSCULAR | Status: AC | PRN
  Administered 2022-01-26: 30 mg via INTRAVENOUS

## 2022-01-26 MED ORDER — LIDOCAINE HCL (PF) 2 % IJ SOLN
INTRAMUSCULAR | Status: AC
  Filled 2022-01-26: qty 5

## 2022-01-26 MED ORDER — HYDROMORPHONE HCL 1 MG/ML IJ SOLN
0.2500 mg | INTRAMUSCULAR | Status: DC | PRN
  Administered 2022-01-26: 0.25 mg via INTRAVENOUS

## 2022-01-26 MED ORDER — MIDAZOLAM HCL 2 MG/2ML IJ SOLN
INTRAMUSCULAR | Status: DC | PRN
  Administered 2022-01-26: 2 mg via INTRAVENOUS

## 2022-01-26 MED ORDER — LIDOCAINE 2% (20 MG/ML) 5 ML SYRINGE
INTRAMUSCULAR | Status: DC | PRN
Start: 2022-01-26 — End: 2022-01-26
  Administered 2022-01-26: 100 mg via INTRAVENOUS

## 2022-01-26 MED ORDER — PROPOFOL 10 MG/ML IV BOLUS
INTRAVENOUS | Status: AC
  Filled 2022-01-26: qty 20

## 2022-01-26 MED ORDER — ROCURONIUM BROMIDE 10 MG/ML (PF) SYRINGE
PREFILLED_SYRINGE | INTRAVENOUS | Status: DC | PRN
  Administered 2022-01-26: 10 mg via INTRAVENOUS
  Administered 2022-01-26: 50 mg via INTRAVENOUS

## 2022-01-26 MED ORDER — OXYCODONE HCL 5 MG PO TABS
5.0000 mg | ORAL_TABLET | ORAL | Status: DC | PRN
  Administered 2022-01-27 – 2022-01-28 (×5): 5 mg via ORAL
  Administered 2022-01-29 – 2022-01-30 (×2): 10 mg via ORAL
  Filled 2022-01-26: qty 1
  Filled 2022-01-26: qty 2
  Filled 2022-01-26 (×2): qty 1
  Filled 2022-01-26: qty 2
  Filled 2022-01-26 (×2): qty 1

## 2022-01-26 MED ORDER — KETOROLAC TROMETHAMINE 30 MG/ML IJ SOLN
INTRAMUSCULAR | Status: AC
  Filled 2022-01-26: qty 1

## 2022-01-26 MED ORDER — CHLORHEXIDINE GLUCONATE CLOTH 2 % EX PADS
6.0000 | MEDICATED_PAD | Freq: Every day | CUTANEOUS | Status: DC
  Administered 2022-01-26 – 2022-01-27 (×2): 6 via TOPICAL

## 2022-01-26 MED ORDER — 0.9 % SODIUM CHLORIDE (POUR BTL) OPTIME
TOPICAL | Status: DC | PRN
  Administered 2022-01-26: 2000 mL

## 2022-01-26 MED ORDER — DEXAMETHASONE SODIUM PHOSPHATE 10 MG/ML IJ SOLN
INTRAMUSCULAR | Status: DC | PRN
  Administered 2022-01-26: 4 mg via INTRAVENOUS

## 2022-01-26 MED ORDER — FENTANYL CITRATE (PF) 100 MCG/2ML IJ SOLN
INTRAMUSCULAR | Status: AC
  Filled 2022-01-26: qty 2

## 2022-01-26 MED ORDER — ENOXAPARIN SODIUM 40 MG/0.4ML IJ SOSY
40.0000 mg | PREFILLED_SYRINGE | INTRAMUSCULAR | Status: DC
  Administered 2022-01-29: 40 mg via SUBCUTANEOUS
  Filled 2022-01-26 (×2): qty 0.4

## 2022-01-26 MED ORDER — DEXAMETHASONE SODIUM PHOSPHATE 10 MG/ML IJ SOLN
INTRAMUSCULAR | Status: AC
  Filled 2022-01-26: qty 1

## 2022-01-26 MED ORDER — ROCURONIUM BROMIDE 10 MG/ML (PF) SYRINGE
PREFILLED_SYRINGE | INTRAVENOUS | Status: AC
  Filled 2022-01-26: qty 10

## 2022-01-26 MED ORDER — ENSURE SURGERY PO LIQD
237.0000 mL | Freq: Two times a day (BID) | ORAL | Status: DC
  Administered 2022-01-28 – 2022-01-30 (×4): 237 mL via ORAL

## 2022-01-26 MED ORDER — ACETAMINOPHEN 500 MG PO TABS: 1000.0000 mg | ORAL_TABLET | Freq: Four times a day (QID) | ORAL | Status: DC | PRN

## 2022-01-26 MED ORDER — HYDROMORPHONE HCL 2 MG/ML IJ SOLN
INTRAMUSCULAR | Status: AC
  Filled 2022-01-26: qty 1

## 2022-01-26 MED ORDER — LACTATED RINGERS IV SOLN: INTRAVENOUS | Status: DC

## 2022-01-26 MED ORDER — HYDROMORPHONE HCL 1 MG/ML IJ SOLN
INTRAMUSCULAR | Status: AC
  Administered 2022-01-26: 0.5 mg via INTRAVENOUS
  Filled 2022-01-26: qty 2

## 2022-01-26 MED ORDER — HYDROMORPHONE HCL 1 MG/ML IJ SOLN
INTRAMUSCULAR | Status: DC | PRN
  Administered 2022-01-26: .5 mg via INTRAVENOUS

## 2022-01-26 MED ORDER — PROPOFOL 10 MG/ML IV BOLUS
INTRAVENOUS | Status: DC | PRN
Start: 2022-01-26 — End: 2022-01-26
  Administered 2022-01-26: 150 mg via INTRAVENOUS

## 2022-01-26 MED ORDER — ONDANSETRON HCL 4 MG/2ML IJ SOLN
INTRAMUSCULAR | Status: DC | PRN
  Administered 2022-01-26: 4 mg via INTRAVENOUS

## 2022-01-26 MED ORDER — SUGAMMADEX SODIUM 200 MG/2ML IV SOLN
INTRAVENOUS | Status: DC | PRN
  Administered 2022-01-26: 120 mg via INTRAVENOUS

## 2022-01-26 MED ORDER — FENTANYL CITRATE (PF) 250 MCG/5ML IJ SOLN
INTRAMUSCULAR | Status: DC | PRN
  Administered 2022-01-26 (×3): 50 ug via INTRAVENOUS
  Administered 2022-01-26: 100 ug via INTRAVENOUS
  Administered 2022-01-26: 50 ug via INTRAVENOUS

## 2022-01-26 MED ORDER — MIDAZOLAM HCL 2 MG/2ML IJ SOLN
INTRAMUSCULAR | Status: AC
  Filled 2022-01-26: qty 2

## 2022-01-26 MED ORDER — ONDANSETRON HCL 4 MG/2ML IJ SOLN: 4.0000 mg | Freq: Once | INTRAMUSCULAR | Status: DC | PRN

## 2022-01-26 MED ORDER — DROPERIDOL 2.5 MG/ML IJ SOLN
INTRAMUSCULAR | Status: DC | PRN
  Administered 2022-01-26: .625 mg via INTRAVENOUS

## 2022-01-26 MED ORDER — MORPHINE SULFATE (PF) 2 MG/ML IV SOLN
2.0000 mg | INTRAVENOUS | Status: DC | PRN
  Administered 2022-01-26 – 2022-01-27 (×4): 2 mg via INTRAVENOUS
  Filled 2022-01-26 (×4): qty 1

## 2022-01-26 SURGICAL SUPPLY — 39 items
BAG COUNTER SPONGE SURGICOUNT (BAG) IMPLANT
BLADE EXTENDED COATED 6.5IN (ELECTRODE) IMPLANT
CHLORAPREP W/TINT 26 (MISCELLANEOUS) ×4 IMPLANT
COVER SURGICAL LIGHT HANDLE (MISCELLANEOUS) ×4 IMPLANT
DRSG OPSITE POSTOP 4X10 (GAUZE/BANDAGES/DRESSINGS) IMPLANT
DRSG OPSITE POSTOP 4X6 (GAUZE/BANDAGES/DRESSINGS) ×1 IMPLANT
DRSG OPSITE POSTOP 4X8 (GAUZE/BANDAGES/DRESSINGS) IMPLANT
DRSG PAD ABDOMINAL 8X10 ST (GAUZE/BANDAGES/DRESSINGS) IMPLANT
ELECT REM PT RETURN 15FT ADLT (MISCELLANEOUS) ×2 IMPLANT
GAUZE SPONGE 4X4 12PLY STRL (GAUZE/BANDAGES/DRESSINGS) IMPLANT
GLOVE SURG ORTHO 8.0 STRL STRW (GLOVE) ×4 IMPLANT
GLOVE SURG SYN 7.5  E (GLOVE) ×1
GLOVE SURG SYN 7.5 E (GLOVE) ×1 IMPLANT
GLOVE SURG SYN 7.5 PF PI (GLOVE) ×1 IMPLANT
GOWN STRL REUS W/ TWL XL LVL3 (GOWN DISPOSABLE) ×4 IMPLANT
GOWN STRL REUS W/TWL XL LVL3 (GOWN DISPOSABLE) ×4
HANDLE SUCTION POOLE (INSTRUMENTS) IMPLANT
KIT TURNOVER KIT A (KITS) IMPLANT
LIGASURE IMPACT 36 18CM CVD LR (INSTRUMENTS) ×1 IMPLANT
PACK COLON (CUSTOM PROCEDURE TRAY) ×2 IMPLANT
PENCIL SMOKE EVACUATOR (MISCELLANEOUS) ×1 IMPLANT
SPONGE T-LAP 18X18 ~~LOC~~+RFID (SPONGE) ×2 IMPLANT
STAPLER VISISTAT 35W (STAPLE) ×2 IMPLANT
SUCTION POOLE HANDLE (INSTRUMENTS)
SUT NOV 1 T60/GS (SUTURE) IMPLANT
SUT NOVA NAB DX-16 0-1 5-0 T12 (SUTURE) IMPLANT
SUT NOVA T20/GS 25 (SUTURE) IMPLANT
SUT PDS AB 1 TP1 96 (SUTURE) IMPLANT
SUT SILK 2 0 (SUTURE) ×2
SUT SILK 2 0 SH CR/8 (SUTURE) ×2 IMPLANT
SUT SILK 2 0SH CR/8 30 (SUTURE) IMPLANT
SUT SILK 2-0 18XBRD TIE 12 (SUTURE) ×1 IMPLANT
SUT SILK 2-0 30XBRD TIE 12 (SUTURE) ×1 IMPLANT
SUT SILK 3 0 (SUTURE) ×1
SUT SILK 3 0 SH CR/8 (SUTURE) ×5 IMPLANT
SUT SILK 3-0 18XBRD TIE 12 (SUTURE) ×1 IMPLANT
TOWEL OR 17X26 10 PK STRL BLUE (TOWEL DISPOSABLE) IMPLANT
TOWEL OR NON WOVEN STRL DISP B (DISPOSABLE) ×4 IMPLANT
TUBING CONNECTING 10 (TUBING) ×4 IMPLANT

## 2022-01-26 NOTE — Progress Notes (Signed)
Patient reported that she no longer cannot drink the bowel prep as it makes her sick and vomit. We will continue to monitor. ?

## 2022-01-26 NOTE — Transfer of Care (Signed)
Immediate Anesthesia Transfer of Care Note ? ?Patient: Alexandria Frank ? ?Procedure(s) Performed: PARTIAL SIGMOID COLECTOMY (Abdomen) ? ?Patient Location: PACU ? ?Anesthesia Type:General ? ?Level of Consciousness: awake, alert  and patient cooperative ? ?Airway & Oxygen Therapy: Patient Spontanous Breathing and Patient connected to face mask oxygen ? ?Post-op Assessment: Report given to RN and Post -op Vital signs reviewed and stable ? ?Post vital signs: Reviewed and stable ? ?Last Vitals:  ?Vitals Value Taken Time  ?BP 149/88 01/26/22 1200  ?Temp    ?Pulse 86 01/26/22 1202  ?Resp 13 01/26/22 1202  ?SpO2 100 % 01/26/22 1202  ?Vitals shown include unvalidated device data. ? ?Last Pain:  ?Vitals:  ? 01/26/22 0759  ?TempSrc: Oral  ?PainSc: 0-No pain  ?   ? ?Patients Stated Pain Goal: 3 (01/26/22 0759) ? ?Complications: No notable events documented. ?

## 2022-01-26 NOTE — Anesthesia Preprocedure Evaluation (Signed)
Anesthesia Evaluation  ?Patient identified by MRN, date of birth, ID band ?Patient awake ? ? ? ?Reviewed: ?Allergy & Precautions, NPO status , Patient's Chart, lab work & pertinent test results ? ?Airway ?Mallampati: II ? ?TM Distance: >3 FB ?Neck ROM: Full ? ? ? Dental ?no notable dental hx. ? ?  ?Pulmonary ?neg pulmonary ROS,  ?  ?Pulmonary exam normal ?breath sounds clear to auscultation ? ? ? ? ? ? Cardiovascular ?negative cardio ROS ?Normal cardiovascular exam ?Rhythm:Regular Rate:Normal ? ? ?  ?Neuro/Psych ?negative neurological ROS ? negative psych ROS  ? GI/Hepatic ?negative GI ROS, Neg liver ROS,   ?Endo/Other  ?Hyperthyroidism  ? Renal/GU ?negative Renal ROS  ?negative genitourinary ?  ?Musculoskeletal ?negative musculoskeletal ROS ?(+)  ? Abdominal ?  ?Peds ?negative pediatric ROS ?(+)  Hematology ? ?(+) Blood dyscrasia, anemia ,   ?Anesthesia Other Findings ? ? Reproductive/Obstetrics ?negative OB ROS ? ?  ? ? ? ? ? ? ? ? ? ? ? ? ? ?  ?  ? ? ? ? ? ? ? ? ?Anesthesia Physical ?Anesthesia Plan ? ?ASA: 3 ? ?Anesthesia Plan: General  ? ?Post-op Pain Management: Dilaudid IV  ? ?Induction: Intravenous and Rapid sequence ? ?PONV Risk Score and Plan: 3 and Ondansetron, Dexamethasone, Droperidol, Midazolam and Treatment may vary due to age or medical condition ? ?Airway Management Planned: Oral ETT ? ?Additional Equipment:  ? ?Intra-op Plan:  ? ?Post-operative Plan: Extubation in OR ? ?Informed Consent: I have reviewed the patients History and Physical, chart, labs and discussed the procedure including the risks, benefits and alternatives for the proposed anesthesia with the patient or authorized representative who has indicated his/her understanding and acceptance.  ? ? ? ?Dental advisory given ? ?Plan Discussed with: CRNA and Surgeon ? ?Anesthesia Plan Comments:   ? ? ? ? ? ? ?Anesthesia Quick Evaluation ? ?

## 2022-01-26 NOTE — Interval H&P Note (Signed)
History and Physical Interval Note:  01/26/2022 9:19 AM  Alexandria Frank  has presented today for surgery, with the diagnosis of Rectosigmoid Intussusception.  The various methods of treatment have been discussed with the patient and family. After consideration of risks, benefits and other options for treatment, the patient has consented to   Procedure: Sigmoid colectomy   The patient's history has been reviewed, patient examined, no change in status, stable for surgery.  I have reviewed the patient's chart and labs.  Questions were answered to the patient's satisfaction.    Darnell Level, MD [facility] practice Office: [phone]    Darnell Level

## 2022-01-26 NOTE — Anesthesia Postprocedure Evaluation (Signed)
Anesthesia Post Note ? ?Patient: Alexandria Frank ? ?Procedure(s) Performed: PARTIAL SIGMOID COLECTOMY (Abdomen) ? ?  ? ?Patient location during evaluation: PACU ?Anesthesia Type: General ?Level of consciousness: awake and alert ?Pain management: pain level controlled ?Vital Signs Assessment: post-procedure vital signs reviewed and stable ?Respiratory status: spontaneous breathing, nonlabored ventilation, respiratory function stable and patient connected to nasal cannula oxygen ?Cardiovascular status: blood pressure returned to baseline and stable ?Postop Assessment: no apparent nausea or vomiting ?Anesthetic complications: no ? ? ?No notable events documented. ? ?Last Vitals:  ?Vitals:  ? 01/26/22 1515 01/26/22 1616  ?BP: 120/76 137/81  ?Pulse: 79 75  ?Resp: 14 14  ?Temp: 36.6 ?C 36.7 ?C  ?SpO2: 100% 100%  ?  ?Last Pain:  ?Vitals:  ? 01/26/22 1616  ?TempSrc: Oral  ?PainSc: 9   ? ? ?  ?  ?  ?  ?  ?  ? ?Jayse Hodkinson S ? ? ? ? ?

## 2022-01-26 NOTE — Anesthesia Procedure Notes (Addendum)
Procedure Name: Intubation ?Date/Time: 01/26/2022 9:56 AM ?Performed by: Eben Burow, CRNA ?Pre-anesthesia Checklist: Patient identified, Emergency Drugs available, Suction available, Patient being monitored and Timeout performed ?Patient Re-evaluated:Patient Re-evaluated prior to induction ?Oxygen Delivery Method: Circle system utilized ?Preoxygenation: Pre-oxygenation with 100% oxygen ?Induction Type: IV induction and Rapid sequence ?Laryngoscope Size: Mac and 4 ?Grade View: Grade II ?Tube type: Oral ?Tube size: 7.0 mm ?Number of attempts: 1 ?Airway Equipment and Method: Stylet ?Placement Confirmation: ETT inserted through vocal cords under direct vision, positive ETCO2 and breath sounds checked- equal and bilateral ?Secured at: 21 cm ?Tube secured with: Tape ?Dental Injury: Teeth and Oropharynx as per pre-operative assessment  ? ? ? ? ?

## 2022-01-26 NOTE — Op Note (Signed)
Operative Note. ? ?Pre-operative Diagnosis:  colonic intussusception ? ?Post-operative Diagnosis:  colonic intussusception with sigmoid polypoid mass ? ?Surgeon:  Darnell Level, MD ? ?Assistant:  Leary Roca, PA-C  ? ?Procedure:  open partial sigmoid colectomy ? ?Anesthesia:  general ? ?Estimated Blood Loss:  50 cc ? ?Drains: none ?        ?Specimen: segment sigmoid colon with mass to pathology ? ?Indications: Patient is a 31 year old female who presented with left lower quadrant abdominal pain.  Evaluation included a CT scan showing intussusception of the sigmoid colon into the proximal rectum.  Patient was prepared and brought to the operating room for resection. ? ?Procedure:  The patient was seen in the pre-op holding area. The risks, benefits, complications, treatment options, and expected outcomes were previously discussed with the patient. The patient agreed with the proposed plan and has signed the informed consent form.  The patient was brought to the operating room by the surgical team, identified as Darden Palmer and the procedure verified. A "time out" was completed and the above information confirmed. ? ?Following administration of general anesthesia, the patient was positioned and then prepped and draped in the usual aseptic fashion.  After ascertaining that an adequate level of anesthesia been achieved, a low midline abdominal incision is made with a #10 blade.  Dissection is carried through the subcutaneous tissues with the electrocautery used for hemostasis.  The fascia is incised in the midline and the peritoneal cavity is entered cautiously.  Exploration reveals an intussuscepted segment of the sigmoid colon distally.  Elevation of the bowel easily reduces the intussusception.  There is no sign of ischemia or inflammatory changes in the bowel wall.  There is a palpable mass in the mid sigmoid colon measuring approximately 3-1/2 cm in greatest dimension.  There is no sign of invasion into the bowel  wall or the serosa.  There is no palpable lymphadenopathy. ? ?A decision is made to resect the segment of bowel containing the mass.  A point approximately 10 cm proximal to the mass is selected.  Mesentery is open and the bowel is clamped between bowel clamps and divided with a knife.  A point approximately 6 cm distal to the mass is selected.  Again the mesentery is open and the bowel was divided between clamps with a knife.  The mesentery is then divided using the LigaSure for hemostasis.  The segment of sigmoid colon is completely resected. ? ?The specimen is opened on the back table.  Mucosa appears normal with the exception of a large polypoid irregularly-shaped mass measuring approximately 3 to 4 cm in greatest dimension.  It is submitted fresh to pathology and frozen section biopsy is requested.  Dr. Manning Charity did the frozen section biopsy and felt that this was a benign mass with no evidence of malignancy. ? ?Next a end-to-end anastomosis is created with a single layer of interrupted 3-0 silk sutures.  Anastomosis is widely patent and there is no tension on the anastomosis.  Mesenteric defect is closed with interrupted 2-0 silk sutures. ? ?Gowns and gloves are changed.  Drapes are changed.  Instruments were changed. ? ?Abdomen is irrigated with warm saline which is evacuated.  Anastomosis is inspected.  Operative field is covered with the omentum.  Midline wound is closed with a running #1 PDS suture.  Subcutaneous tissues irrigated.  Subcutaneous tissues are reapproximated with interrupted 3-0 Vicryl sutures.  Skin is closed with a running 3-0 Monocryl subcuticular suture.  Wound is washed and  dried and a honeycomb dressing is applied. ? ?Patient is awakened from anesthesia and transported to the recovery room in stable condition.  The patient tolerated the procedure well. ? ? ?Darnell Level, MD ?[facility] ?Office: [phone] ?  ?

## 2022-01-27 ENCOUNTER — Encounter (HOSPITAL_COMMUNITY): Payer: Self-pay | Admitting: Surgery

## 2022-01-27 LAB — BASIC METABOLIC PANEL
Anion gap: 5 (ref 5–15)
BUN: <5 mg/dL — ABNORMAL LOW (ref 6–20)
CO2: 24 mmol/L (ref 22–32)
Calcium: 9 mg/dL (ref 8.9–10.3)
Chloride: 108 mmol/L (ref 98–111)
Creatinine, Ser: 0.65 mg/dL (ref 0.44–1.00)
GFR, Estimated: >60 mL/min (ref >60)
Glucose, Bld: 100 mg/dL — ABNORMAL HIGH (ref 70–99)
Potassium: 3.7 mmol/L (ref 3.5–5.1)
Sodium: 137 mmol/L (ref 135–145)

## 2022-01-27 LAB — CBC
HCT: 28.4 % — ABNORMAL LOW (ref 36.0–46.0)
Hemoglobin: 8.5 g/dL — ABNORMAL LOW (ref 12.0–15.0)
MCH: 21.7 pg — ABNORMAL LOW (ref 26.0–34.0)
MCHC: 29.9 g/dL — ABNORMAL LOW (ref 30.0–36.0)
MCV: 72.4 fL — ABNORMAL LOW (ref 80.0–100.0)
Platelets: 313 10*3/uL (ref 150–400)
RBC: 3.92 MIL/uL (ref 3.87–5.11)
RDW: 17.2 % — ABNORMAL HIGH (ref 11.5–15.5)
WBC: 16.7 10*3/uL — ABNORMAL HIGH (ref 4.0–10.5)
nRBC: 0 % (ref 0.0–0.2)

## 2022-01-27 LAB — SURGICAL PATHOLOGY

## 2022-01-27 NOTE — Progress Notes (Signed)
? ?Progress Note ? ?1 Day Post-Op  ?Subjective: ?Pt reports some soreness this AM but overall doing well. She just had foley removed around 0500. She is tolerating CLD and denies n/v. Feels a little bloated. No flatus or BM yet. Planning to ambulate today. Discussed that final path is pending but frozen section sent from OR appeared to be benign polyp. Discussed that she will need a colonoscopy once she has healed from this to examine remainder of colon for other polyps as well. Grandmother at bedside this AM.  ? ?Objective: ?Vital signs in last 24 hours: ?Temp:  [97.9 ?F (36.6 ?C)-99.3 ?F (37.4 ?C)] 98.1 ?F (36.7 ?C) (04/28 [id number]) ?Pulse Rate:  [75-98] 89 (04/28 0929) ?Resp:  [12-19] 15 (04/28 0929) ?BP: (120-149)/(76-95) 127/88 (04/28 [id number]) ?SpO2:  [97 %-100 %] 100 % (04/28 0929) ?Weight:  [62 kg] 62 kg (04/28 0500) ?Last BM Date : 01/25/22 ? ?Intake/Output from previous day: ?04/27 0701 - 04/28 0700 ?In: 2967 [P.O.:680; I.V.:2187; IV Piggyback:100] ?Out: 3120 [Urine:3100; Blood:20] ?Intake/Output this shift: ?Total I/O ?In: 150 [P.O.:150] ?Out: -  ? ?PE: ?General: pleasant, WD, thin female who is laying in bed in NAD ?Heart: regular, rate, and rhythm.   ?Lungs: CTAB, no wheezes, rhonchi, or rales noted.  Respiratory effort nonlabored ?Abd: soft, appropriately ttp, +BS, mild distention, honeycomb in place over incision ?MS: all 4 extremities are symmetrical with no cyanosis, clubbing, or edema. ?Skin: warm and dry with no masses, lesions, or rashes ?Neuro: Cranial nerves 2-12 grossly intact, sensation is normal throughout ?Psych: A&Ox3 with an appropriate affect.  ? ? ?Lab Results:  ?Recent Labs  ?  01/25/22 ?0435 01/27/22 ?0400  ?WBC 10.1 16.7*  ?HGB 7.8* 8.5*  ?HCT 26.0* 28.4*  ?PLT 302 313  ? ?BMET ?Recent Labs  ?  01/25/22 ?0435 01/27/22 ?0400  ?NA 137 137  ?K 3.6 3.7  ?CL 109 108  ?CO2 23 24  ?GLUCOSE 90 100*  ?BUN 7 <5*  ?CREATININE 0.62 0.65  ?CALCIUM 8.8* 9.0  ? ?PT/INR ?No results for input(s): LABPROT,  INR in the last 72 hours. ?CMP  ?   ?Component Value Date/Time  ? NA 137 01/27/2022 0400  ? NA 143 07/29/2020 1505  ? K 3.7 01/27/2022 0400  ? CL 108 01/27/2022 0400  ? CO2 24 01/27/2022 0400  ? GLUCOSE 100 (H) 01/27/2022 0400  ? BUN <5 (L) 01/27/2022 0400  ? BUN 9 07/29/2020 1505  ? CREATININE 0.65 01/27/2022 0400  ? CALCIUM 9.0 01/27/2022 0400  ? PROT 7.5 01/16/2022 0800  ? PROT 7.0 07/29/2020 1505  ? ALBUMIN 4.2 01/16/2022 0800  ? ALBUMIN 4.3 07/29/2020 1505  ? AST 19 01/16/2022 0800  ? ALT 12 01/16/2022 0800  ? ALKPHOS 75 01/16/2022 0800  ? BILITOT 0.9 01/16/2022 0800  ? BILITOT 0.7 07/29/2020 1505  ? GFRNONAA >60 01/27/2022 0400  ? GFRAA 133 07/29/2020 1505  ? ?Lipase  ?   ?Component Value Date/Time  ? LIPASE 29 01/16/2022 0800  ? ? ? ? ? ?Studies/Results: ?No results found. ? ?Anti-infectives: ?Anti-infectives (From admission, onward)  ? ? Start     Dose/Rate Route Frequency Ordered Stop  ? 01/26/22 0600  cefoTEtan (CEFOTAN) 2 g in sodium chloride 0.9 % 100 mL IVPB       ? 2 g ?200 mL/hr over 30 Minutes Intravenous On call to O.R. 01/25/22 1332 01/26/22 1017  ? 01/25/22 1500  neomycin (MYCIFRADIN) tablet 1,000 mg  Status:  Discontinued       ?  See Hyperspace for full Linked Orders Report.  ? 1,000 mg Oral 3 times per day 01/25/22 1332 01/26/22 1255  ? 01/25/22 1500  metroNIDAZOLE (FLAGYL) tablet 1,000 mg  Status:  Discontinued       ?See Hyperspace for full Linked Orders Report.  ? 1,000 mg Oral 3 times per day 01/25/22 1332 01/26/22 1255  ? ?  ? ? ? ?Assessment/Plan ?Rectosigmoid intussusception secondary to large colonic polyp ?POD1 s/p open partial sigmoid colectomy with anastomosis 01/26/22 Dr. Harlow Asa ?- final path pending, frozen sent in OR and appeared benign ?- tolerating CLD but no flatus or bowel function yet - keep on CLD until starting to pass some flatus and then likely ADAT ?- continue entereg until return of bowel function ?- encourage mobilization ?- foley removed this AM ?- encourage PO over IV  pain control  ?- will need outpatient GI follow up for colonoscopy in several weeks once she has healed from surgery  ? ?FEN: CLD, IVF @75  cc/h ?VTE: LMWH ?ID: PO neomycin/flagyl as bowel prep, cefotetan pre-op  ? ?Graves disease - f/u with endocrinology as planned  ? LOS: 3 days  ? ? ? ?Norm Parcel, PA-C ?[facility] ?01/27/2022, 11:47 AM ?Please see Amion for pager number during day hours 7:00am-4:30pm ? ?

## 2022-01-28 LAB — BASIC METABOLIC PANEL
Anion gap: 7 (ref 5–15)
BUN: <5 mg/dL — ABNORMAL LOW (ref 6–20)
CO2: 23 mmol/L (ref 22–32)
Calcium: 8.9 mg/dL (ref 8.9–10.3)
Chloride: 107 mmol/L (ref 98–111)
Creatinine, Ser: 0.62 mg/dL (ref 0.44–1.00)
GFR, Estimated: >60 mL/min (ref >60)
Glucose, Bld: 100 mg/dL — ABNORMAL HIGH (ref 70–99)
Potassium: 3.8 mmol/L (ref 3.5–5.1)
Sodium: 137 mmol/L (ref 135–145)

## 2022-01-28 LAB — CBC
HCT: 27.6 % — ABNORMAL LOW (ref 36.0–46.0)
Hemoglobin: 8.1 g/dL — ABNORMAL LOW (ref 12.0–15.0)
MCH: 21.4 pg — ABNORMAL LOW (ref 26.0–34.0)
MCHC: 29.3 g/dL — ABNORMAL LOW (ref 30.0–36.0)
MCV: 73 fL — ABNORMAL LOW (ref 80.0–100.0)
Platelets: 294 10*3/uL (ref 150–400)
RBC: 3.78 MIL/uL — ABNORMAL LOW (ref 3.87–5.11)
RDW: 17.1 % — ABNORMAL HIGH (ref 11.5–15.5)
WBC: 11.3 10*3/uL — ABNORMAL HIGH (ref 4.0–10.5)
nRBC: 0 % (ref 0.0–0.2)

## 2022-01-28 NOTE — Progress Notes (Signed)
2 Days Post-Op  ? ?Subjective/Chief Complaint: ?PT DOING WELL ?Some burning around her incision  ?Having flatus  ? ? ?Objective: ?Vital signs in last 24 hours: ?Temp:  [98.1 ?F (36.7 ?C)-98.5 ?F (36.9 ?C)] 98.1 ?F (36.7 ?C) (04/29 0545) ?Pulse Rate:  [76-89] 76 (04/29 0545) ?Resp:  [15-17] 16 (04/29 0545) ?BP: (114-139)/(68-93) 114/68 (04/29 0545) ?SpO2:  [100 %] 100 % (04/29 0545) ?Weight:  [61.2 kg] 61.2 kg (04/29 0500) ?Last BM Date : 01/27/22 ? ?Intake/Output from previous day: ?04/28 0701 - 04/29 0700 ?In: 2593.5 [P.O.:780; I.V.:1813.5] ?Out: 300 [Urine:300] ?Intake/Output this shift: ?No intake/output data recorded. ? ?General: pleasant, WD, thin female who is laying in bed in NAD ?Heart: regular, rate, and rhythm.   ?Lungs: CTAB, no wheezes, rhonchi, or rales noted.  Respiratory effort nonlabored ?Abd: soft, appropriately ttp, +BS, mild distention, honeycomb in place over incision ?MS: all 4 extremities are symmetrical with no cyanosis, clubbing, or edema. ?Skin: warm and dry with no masses, lesions, or rashes ?Neuro: Cranial nerves 2-12 grossly intact, sensation is normal throughout ?Psych: A&Ox3 with an appropriate affect.  ? ?Lab Results:  ?Recent Labs  ?  01/27/22 ?0400 01/28/22 ?0351  ?WBC 16.7* 11.3*  ?HGB 8.5* 8.1*  ?HCT 28.4* 27.6*  ?PLT 313 294  ? ?BMET ?Recent Labs  ?  01/27/22 ?0400 01/28/22 ?0351  ?NA 137 137  ?K 3.7 3.8  ?CL 108 107  ?CO2 24 23  ?GLUCOSE 100* 100*  ?BUN <5* <5*  ?CREATININE 0.65 0.62  ?CALCIUM 9.0 8.9  ? ?PT/INR ?No results for input(s): LABPROT, INR in the last 72 hours. ?ABG ?No results for input(s): PHART, HCO3 in the last 72 hours. ? ?Invalid input(s): PCO2, PO2 ? ?Studies/Results: ?No results found. ? ?Anti-infectives: ?Anti-infectives (From admission, onward)  ? ? Start     Dose/Rate Route Frequency Ordered Stop  ? 01/26/22 0600  cefoTEtan (CEFOTAN) 2 g in sodium chloride 0.9 % 100 mL IVPB       ? 2 g ?200 mL/hr over 30 Minutes Intravenous On call to O.R. 01/25/22 1332  01/26/22 1017  ? 01/25/22 1500  neomycin (MYCIFRADIN) tablet 1,000 mg  Status:  Discontinued       ?See Hyperspace for full Linked Orders Report.  ? 1,000 mg Oral 3 times per day 01/25/22 1332 01/26/22 1255  ? 01/25/22 1500  metroNIDAZOLE (FLAGYL) tablet 1,000 mg  Status:  Discontinued       ?See Hyperspace for full Linked Orders Report.  ? 1,000 mg Oral 3 times per day 01/25/22 1332 01/26/22 1255  ? ?  ? ? ?Assessment/Plan: ?Rectosigmoid intussusception secondary to large colonic polyp ?POD 2 s/p open partial sigmoid colectomy with anastomosis 01/26/22 Dr. Harlow Asa ?- final path pending, frozen sent in OR and appeared benign ?- tolerating CLD  advance to clears- continue entereg until return of bowel function ?- encourage mobilization ?- foley removed this AM ?- encourage PO over IV pain control  ?- will need outpatient GI follow up for colonoscopy in several weeks once she has healed from surgery  ?  ?FEN: CLD, IVF @50   cc/h ?VTE: LMWH- hold for today due to low hgb- if stable, can restart tomorrow  ?ID: PO neomycin/flagyl as bowel prep, cefotetan pre-op  ? ? LOS: 4 days  ? ? ?Turner Daniels MD  ?01/28/2022 ? ?

## 2022-01-29 LAB — CBC
HCT: 29.7 % — ABNORMAL LOW (ref 36.0–46.0)
Hemoglobin: 8.6 g/dL — ABNORMAL LOW (ref 12.0–15.0)
MCH: 21.2 pg — ABNORMAL LOW (ref 26.0–34.0)
MCHC: 29 g/dL — ABNORMAL LOW (ref 30.0–36.0)
MCV: 73.3 fL — ABNORMAL LOW (ref 80.0–100.0)
Platelets: 349 10*3/uL (ref 150–400)
RBC: 4.05 MIL/uL (ref 3.87–5.11)
RDW: 17.1 % — ABNORMAL HIGH (ref 11.5–15.5)
WBC: 12.1 10*3/uL — ABNORMAL HIGH (ref 4.0–10.5)
nRBC: 0 % (ref 0.0–0.2)

## 2022-01-29 NOTE — Plan of Care (Signed)
?  Problem: Education: ?Goal: Knowledge of General Education information will improve ?Description: Including pain rating scale, medication(s)/side effects and non-pharmacologic comfort measures ?Outcome: Progressing ?  ?Problem: Activity: ?Goal: Risk for activity intolerance will decrease ?Outcome: Adequate for Discharge ?  ?Problem: Nutrition: ?Goal: Adequate nutrition will be maintained ?Outcome: Progressing ?  ?Problem: Pain Managment: ?Goal: General experience of comfort will improve ?Outcome: Progressing ?  ?

## 2022-01-29 NOTE — Progress Notes (Signed)
3 Days Post-Op  ? ?Subjective/Chief Complaint: ?Patient passing flatus.  She is walking.  No nausea or vomiting.  Pain is moderate but controllable. ? ? ?Objective: ?Vital signs in last 24 hours: ?Temp:  [98.1 ?F (36.7 ?C)-98.8 ?F (37.1 ?C)] 98.3 ?F (36.8 ?C) (04/30 1224) ?Pulse Rate:  [70-97] 85 (04/30 0514) ?Resp:  [18] 18 (04/30 0514) ?BP: (119-140)/(74-88) 121/80 (04/30 0514) ?SpO2:  [100 %] 100 % (04/30 0514) ?Last BM Date : 01/27/22 ? ?Intake/Output from previous day: ?04/29 0701 - 04/30 0700 ?In: 2677.9 [P.O.:600; I.V.:2077.9] ?Out: -  ?Intake/Output this shift: ?No intake/output data recorded. ? ?General appearance: alert and cooperative ?Resp: clear to auscultation bilaterally ?Cardio: Normal sinus rhythm ?Incision/Wound: Soft incision clean dry intact with honeycomb dressing in place.  Sore around the incision but no peritonitis.  Nondistended ? ?Lab Results:  ?Recent Labs  ?  01/28/22 ?0351 01/29/22 ?0352  ?WBC 11.3* 12.1*  ?HGB 8.1* 8.6*  ?HCT 27.6* 29.7*  ?PLT 294 349  ? ?BMET ?Recent Labs  ?  01/27/22 ?0400 01/28/22 ?0351  ?NA 137 137  ?K 3.7 3.8  ?CL 108 107  ?CO2 24 23  ?GLUCOSE 100* 100*  ?BUN <5* <5*  ?CREATININE 0.65 0.62  ?CALCIUM 9.0 8.9  ? ?PT/INR ?No results for input(s): LABPROT, INR in the last 72 hours. ?ABG ?No results for input(s): PHART, HCO3 in the last 72 hours. ? ?Invalid input(s): PCO2, PO2 ? ?Studies/Results: ?No results found. ? ?Anti-infectives: ?Anti-infectives (From admission, onward)  ? ? Start     Dose/Rate Route Frequency Ordered Stop  ? 01/26/22 0600  cefoTEtan (CEFOTAN) 2 g in sodium chloride 0.9 % 100 mL IVPB       ? 2 g ?200 mL/hr over 30 Minutes Intravenous On call to O.R. 01/25/22 1332 01/26/22 1017  ? 01/25/22 1500  neomycin (MYCIFRADIN) tablet 1,000 mg  Status:  Discontinued       ?See Hyperspace for full Linked Orders Report.  ? 1,000 mg Oral 3 times per day 01/25/22 1332 01/26/22 1255  ? 01/25/22 1500  metroNIDAZOLE (FLAGYL) tablet 1,000 mg  Status:  Discontinued        ?See Hyperspace for full Linked Orders Report.  ? 1,000 mg Oral 3 times per day 01/25/22 1332 01/26/22 1255  ? ?  ? ? ?Assessment/Plan: ?s/p Procedure(s): ?PARTIAL SIGMOID COLECTOMY (N/A) ?Continue ambulation ? ?Advance diet ? ?Saline lock IV ? LOS: 5 days  ? ? ?Maks Cavallero A Talayah Picardi ?01/29/2022 ? ?

## 2022-01-30 MED ORDER — ACETAMINOPHEN 500 MG PO TABS: 1000.0000 mg | ORAL_TABLET | Freq: Three times a day (TID) | ORAL | Status: DC | PRN

## 2022-01-30 MED ORDER — OXYCODONE HCL 5 MG PO TABS: 5.0000 mg | ORAL_TABLET | Freq: Four times a day (QID) | ORAL | 0 refills | Status: DC | PRN

## 2022-01-30 MED ORDER — IBUPROFEN 600 MG PO TABS: 600.0000 mg | ORAL_TABLET | Freq: Three times a day (TID) | ORAL | 0 refills | Status: DC | PRN

## 2022-01-30 NOTE — Discharge Summary (Signed)
[facility] ?Discharge Summary  ? ?Patient ID: ?Alexandria Frank ?MRN: [id number] ?DOB/AGE: [birth year] 28 y.o. ? ?Admit date: 01/24/2022 ?Discharge date: 01/30/2022 ? ?Admitting Diagnosis: ?Rectosigmoid intussusception ?Graves disease ? ?Discharge Diagnosis ?Rectosigmoid intussusception secondary to large colonic polyp ?S/p partial colectomy with anastomosis  ?Graves disease ? ?Consultants ?None  ? ?Imaging: ?No results found. ? ?Procedures ?Dr. Tawanna Cooler Gerkin (01/26/22) - Open partial sigmoid colectomy  ? ?Hospital Course:  ?Patient is a 31 year old female who presented to the ED with intermittent abdominal pain and bloody stools.  Workup showed rectosigmoid intussusception. Patient was admitted and underwent procedure listed above. Large polyp noted as lead point for intussusception intra-operatively, this was sent for frozen during the case and appeared to be benign so just sigmoid colectomy was done. Tolerated procedure well and was transferred to the floor.  Diet was advanced as tolerated. Final surgical pathology came back as benign also. On POD4, the patient was voiding well, tolerating diet, ambulating well, pain well controlled, vital signs stable, incision c/d/i and felt stable for discharge home.  Patient will follow up in our office in 4 weeks and knows to call with questions or concerns. She will call to confirm appointment date/time.   ? ?Physical Exam: ?General:  Alert, NAD, pleasant, comfortable ?Abd:  Soft, ND, mild tenderness, incision C/D/I ? ?I or a member of my team have reviewed this patient in the Controlled Substance Database. ? ? ?Allergies as of 01/30/2022   ?No Known Allergies ?  ? ?  ?Medication List  ?  ? ?TAKE these medications   ? ?acetaminophen 500 MG tablet ?Commonly known as: TYLENOL ?Take 2 tablets (1,000 mg total) by mouth every 8 (eight) hours as needed for mild pain or fever. ?  ?hydrOXYzine 25 MG tablet ?Commonly known as: ATARAX ?TAKE ONE HALF OR ONE WHOLE TABLET BY MOUTH THREE  TIMES A DAY AS NEEDED FOR ANXIETY ?What changed: See the new instructions. ?  ?ibuprofen 600 MG tablet ?Commonly known as: ADVIL ?Take 1 tablet (600 mg total) by mouth every 8 (eight) hours as needed for moderate pain. Make sure you take with food ?  ?methimazole 5 MG tablet ?Commonly known as: TAPAZOLE ?Take 1 tablet (5 mg total) by mouth daily. ?What changed: additional instructions ?  ?OVER THE COUNTER MEDICATION ?Take 15 mLs by mouth daily. [place] ?  ?oxyCODONE 5 MG immediate release tablet ?Commonly known as: Oxy IR/ROXICODONE ?Take 1 tablet (5 mg total) by mouth every 6 (six) hours as needed for severe pain or moderate pain. ?  ? ?  ? ? ? ? Follow-up Information   ? ? Claiborne Rigg, NP. Call.   ?Specialty: Nurse Practitioner ?Why: Have your PCP refer you to a GI doctor for colonoscopy in about 8 weeks to assess remainder of colon for polyps ?Contact information: ?[address] ?[place] [address] ?[phone] ? ? ?  ?  ? ? Darnell Level, MD. Nyra Capes on 03/03/2022.   ?Specialty: General Surgery ?Why: 3:00 PM. Please arrive 30 min prior to appointment. Have photo ID and insurance information with you. ?Contact information: ?[address] ?[address] ?[place] [address] ?[phone] ? ? ?  ?  ? ?  ?  ? ?  ? ? ?Signed: ?Juliet Rude , PA-C ?[facility] ?01/30/2022, 10:38 AM ?Please see Amion for pager number during day hours 7:00am-4:30pm ? ? ? ?

## 2022-01-30 NOTE — Progress Notes (Signed)
Patient given discharge instructions, and verbalized an understanding of all discharge instructions.  Patient agrees with discharge plan, and is being discharged in stable medical condition.  Patient given transportation via wheelchair. 

## 2022-01-30 NOTE — Discharge Instructions (Signed)
[facility]      [facility] °[phone] ° °OPEN ABDOMINAL SURGERY: POST OP INSTRUCTIONS ° °Always review your discharge instruction sheet given to you by the facility where your surgery was performed. ° °IF YOU HAVE DISABILITY OR FAMILY LEAVE FORMS, YOU MUST BRING THEM TO THE OFFICE FOR PROCESSING.  PLEASE DO NOT GIVE THEM TO YOUR DOCTOR. ° °A prescription for pain medication may be given to you upon discharge.  Take your pain medication as prescribed, if needed.  If narcotic pain medicine is not needed, then you may take acetaminophen (Tylenol) or ibuprofen (Advil) as needed. °Take your usually prescribed medications unless otherwise directed. °If you need a refill on your pain medication, please contact your pharmacy. They will contact our office to request authorization.  Prescriptions will not be filled after 5pm or on week-ends. °You should follow a light diet the first few days after arrival home, such as soup and crackers, pudding, etc.unless your doctor has advised otherwise. A high-fiber, low fat diet can be resumed as tolerated.   Be sure to include lots of fluids daily. Most patients will experience some swelling and bruising on the chest and neck area.  Ice packs will help.  Swelling and bruising can take several days to resolve °Most patients will experience some swelling and bruising in the area of the incision. Ice pack will help. Swelling and bruising can take several days to resolve..  °It is common to experience some constipation if taking pain medication after surgery.  Increasing fluid intake and taking a stool softener will usually help or prevent this problem from occurring.  A mild laxative (Milk of Magnesia or Miralax) should be taken according to package directions if there are no bowel movements after 48 hours. ° You may have steri-strips (small skin tapes) in place directly over the incision.  These strips should be left on the skin for 7-10 days.  If your surgeon used skin  glue on the incision, you may shower in 24 hours.  The glue will flake off over the next 2-3 weeks.  Any sutures or staples will be removed at the office during your follow-up visit. You may find that a light gauze bandage over your incision may keep your staples from being rubbed or pulled. You may shower and replace the bandage daily. °ACTIVITIES:  You may resume regular (light) daily activities beginning the next day--such as daily self-care, walking, climbing stairs--gradually increasing activities as tolerated.  You may have sexual intercourse when it is comfortable.  Refrain from any heavy lifting or straining until approved by your doctor. °You may drive when you no longer are taking prescription pain medication, you can comfortably wear a seatbelt, and you can safely maneuver your car and apply brakes ° °You should see your doctor in the office for a follow-up appointment approximately two weeks after your surgery.  Make sure that you call for this appointment within a day or two after you arrive home to insure a convenient appointment time. ° °WHEN TO CALL YOUR DOCTOR: °Fever over 101.0 °Inability to urinate °Nausea and/or vomiting °Extreme swelling or bruising °Continued bleeding from incision. °Increased pain, redness, or drainage from the incision. °Difficulty swallowing or breathing °Muscle cramping or spasms. °Numbness or tingling in hands or feet or around lips. ° °The clinic staff is available to answer your questions during regular business hours.  Please don’t hesitate to call and ask to speak to one of the nurses if you have concerns. ° °For   further questions, please visit [email or web address]  °

## 2022-01-31 ENCOUNTER — Telehealth: Payer: Self-pay

## 2022-01-31 NOTE — Telephone Encounter (Signed)
Transition Care Management Follow-up Telephone Call ?Date of discharge and from where: 01/30/2022, [facility]  ?How have you been since you were released from the hospital? She said she is not feeling too bad.  Trying to get something to eat.  ?Any questions or concerns? No ? ?Items Reviewed: ?Did the pt receive and understand the discharge instructions provided? Yes  ?Medications obtained and verified? Yes  - she said she has all of her medications.  ?Other? No  ?Any new allergies since your discharge? No  ?Dietary orders reviewed? No ?Do you have support at home? Yes  ? ?Home Care and Equipment/Supplies: ?Were home health services ordered? no ?If so, what is the name of the agency? N/a  ?Has the agency set up a time to come to the patient's home? not applicable ?Were any new equipment or medical supplies ordered?  No ?What is the name of the medical supply agency? N/a ?Were you able to get the supplies/equipment? not applicable ?Do you have any questions related to the use of the equipment or supplies? No ? ?Functional Questionnaire: (I = Independent and D = Dependent) ?ADLs: independent ? ?Follow up appointments reviewed: ? ?PCP Hospital f/u appt confirmed? Yes  Scheduled to see Geryl Rankins, NP - 02/15/2022  ?[facility] f/u appt confirmed? Yes  Scheduled to see surgeon - 03/03/2022.  ?Are transportation arrangements needed? No  ?If their condition worsens, is the pt aware to call PCP or go to the Emergency Dept.? Yes ?Was the patient provided with contact information for the PCP's office or ED? Yes ?Was to pt encouraged to call back with questions or concerns? Yes ? ?

## 2022-02-15 ENCOUNTER — Ambulatory Visit: Payer: Managed Care, Other (non HMO) | Admitting: Nurse Practitioner

## 2022-03-02 DIAGNOSIS — Z9049 Acquired absence of other specified parts of digestive tract: Secondary | ICD-10-CM | POA: Insufficient documentation

## 2022-03-02 DIAGNOSIS — D126 Benign neoplasm of colon, unspecified: Secondary | ICD-10-CM | POA: Insufficient documentation

## 2022-03-31 ENCOUNTER — Ambulatory Visit: Payer: Managed Care, Other (non HMO) | Attending: Nurse Practitioner | Admitting: Nurse Practitioner

## 2022-03-31 ENCOUNTER — Encounter: Payer: Self-pay | Admitting: Nurse Practitioner

## 2022-03-31 VITALS — BP 124/76 | HR 75 | Ht 70.0 in | Wt 147.0 lb

## 2022-03-31 DIAGNOSIS — E0590 Thyrotoxicosis, unspecified without thyrotoxic crisis or storm: Secondary | ICD-10-CM

## 2022-03-31 DIAGNOSIS — Z7251 High risk heterosexual behavior: Secondary | ICD-10-CM | POA: Diagnosis not present

## 2022-03-31 DIAGNOSIS — K561 Intussusception: Secondary | ICD-10-CM | POA: Diagnosis not present

## 2022-03-31 DIAGNOSIS — F419 Anxiety disorder, unspecified: Secondary | ICD-10-CM

## 2022-03-31 MED ORDER — METHIMAZOLE 5 MG PO TABS: 5.0000 mg | ORAL_TABLET | Freq: Every day | ORAL | 3 refills | Status: DC

## 2022-03-31 MED ORDER — HYDROXYZINE HCL 25 MG PO TABS: 25.0000 mg | ORAL_TABLET | Freq: Three times a day (TID) | ORAL | 2 refills | Status: DC | PRN

## 2022-04-02 ENCOUNTER — Encounter: Payer: Self-pay | Admitting: Nurse Practitioner

## 2022-04-02 NOTE — Progress Notes (Signed)
Assessment & Plan:  Alexandria Frank was seen today for referral.  Diagnoses and all orders for this visit:  Intussusception of large intestine (HCC) -     Ambulatory referral to Gastroenterology  Anxiety -     hydrOXYzine (ATARAX) 25 MG tablet; Take 1 tablet (25 mg total) by mouth every 8 (eight) hours as needed for anxiety. -     Ambulatory referral to Integrated Behavioral Health  Hyperthyroidism -     methimazole (TAPAZOLE) 5 MG tablet; Take 1 tablet (5 mg total) by mouth daily.  High risk heterosexual behavior -     Cervicovaginal ancillary only    Patient has been counseled on age-appropriate routine health concerns for screening and prevention. These are reviewed and up-to-date. Referrals have been placed accordingly. Immunizations are up-to-date or declined.    Subjective:   Chief Complaint  Patient presents with   Referral   HPI Alexandria Frank 31 y.o. female presents to office today requesting referral to GI. She is s/p partial sigmoid colectomy 2/2 intussusception and large tubulovillous adenoma. Needs referral for possible colonoscopy in a few months to rule out any other significant polyps in the the colon.   Also requesting referral to Behavioral health services for Situational depression and anxiety. Taking hydroxyzine and declines SSRI. She denies any current thoughts of self harm.   Hyperthyroidism Normal thyroid level. Will continue tapazole 5 mg daily.  Lab Results  Component Value Date   TSH 2.642 01/24/2022    Requesting wet prep. Denies any current symptoms of vaginitis.    Review of Systems  Constitutional:  Negative for fever, malaise/fatigue and weight loss.  HENT: Negative.  Negative for nosebleeds.   Eyes: Negative.  Negative for blurred vision, double vision and photophobia.  Respiratory: Negative.  Negative for cough and shortness of breath.   Cardiovascular: Negative.  Negative for chest pain, palpitations and leg swelling.  Gastrointestinal:  Negative.  Negative for heartburn, nausea and vomiting.  Musculoskeletal: Negative.  Negative for myalgias.  Neurological: Negative.  Negative for dizziness, focal weakness, seizures and headaches.  Psychiatric/Behavioral:  Positive for depression. Negative for suicidal ideas. The patient is nervous/anxious.     Past Medical History:  Diagnosis Date   Anxiety    Thyroid disease     Past Surgical History:  Procedure Laterality Date   COLECTOMY N/A 01/26/2022   Procedure: PARTIAL SIGMOID COLECTOMY;  Surgeon: Darnell Level, MD;  Location: [facility];  Service: General;  Laterality: N/A;   NO PAST SURGERIES      Family History  Problem Relation Age of Onset   Hyperthyroidism Mother    Hyperthyroidism Maternal Grandmother    Hyperthyroidism Maternal Aunt     Social History Reviewed with no changes to be made today.   Outpatient Medications Prior to Visit  Medication Sig Dispense Refill   acetaminophen (TYLENOL) 500 MG tablet Take 2 tablets (1,000 mg total) by mouth every 8 (eight) hours as needed for mild pain or fever.     ibuprofen (ADVIL) 600 MG tablet Take 1 tablet (600 mg total) by mouth every 8 (eight) hours as needed for moderate pain. Make sure you take with food 30 tablet 0   OVER THE COUNTER MEDICATION Take 15 mLs by mouth daily. [employer]     hydrOXYzine (ATARAX/VISTARIL) 25 MG tablet TAKE ONE HALF OR ONE WHOLE TABLET BY MOUTH THREE TIMES A DAY AS NEEDED FOR ANXIETY (Patient taking differently: Take 25 mg by mouth every 8 (eight) hours as needed for anxiety.) 90  tablet 1   methimazole (TAPAZOLE) 5 MG tablet Take 1 tablet (5 mg total) by mouth daily. (Patient taking differently: Take 5 mg by mouth daily. Recently changed to QD was BID) 120 tablet 3   oxyCODONE (OXY IR/ROXICODONE) 5 MG immediate release tablet Take 1 tablet (5 mg total) by mouth every 6 (six) hours as needed for severe pain or moderate pain. 30 tablet 0   No facility-administered medications prior to visit.    No  Known Allergies     Objective:    BP 124/76   Pulse 75   Ht 5\' 10"  (1.778 m)   Wt 147 lb (66.7 kg)   SpO2 100%   BMI 21.09 kg/m  Wt Readings from Last 3 Encounters:  03/31/22 147 lb (66.7 kg)  01/28/22 135 lb (61.2 kg)  09/22/21 155 lb 12.8 oz (70.7 kg)    Physical Exam Vitals and nursing note reviewed.  Constitutional:      Appearance: She is well-developed.  HENT:     Head: Normocephalic and atraumatic.  Cardiovascular:     Rate and Rhythm: Normal rate and regular rhythm.     Heart sounds: Normal heart sounds. No murmur heard.    No friction rub. No gallop.  Pulmonary:     Effort: Pulmonary effort is normal. No tachypnea or respiratory distress.     Breath sounds: Normal breath sounds. No decreased breath sounds, wheezing, rhonchi or rales.  Chest:     Chest wall: No tenderness.  Abdominal:     General: Bowel sounds are normal.     Palpations: Abdomen is soft.  Musculoskeletal:        General: Normal range of motion.     Cervical back: Normal range of motion.  Skin:    General: Skin is warm and dry.  Neurological:     Mental Status: She is alert and oriented to person, place, and time.     Coordination: Coordination normal.  Psychiatric:        Behavior: Behavior normal. Behavior is cooperative.        Thought Content: Thought content normal.        Judgment: Judgment normal.          Patient has been counseled extensively about nutrition and exercise as well as the importance of adherence with medications and regular follow-up. The patient was given clear instructions to go to ER or return to medical center if symptoms don't improve, worsen or new problems develop. The patient verbalized understanding.   Follow-up: Return if symptoms worsen or fail to improve.   09/24/21, FNP-BC [facility] [place], [place]   04/02/2022, 9:34 PM

## 2022-04-12 ENCOUNTER — Ambulatory Visit: Payer: Managed Care, Other (non HMO)

## 2022-04-12 ENCOUNTER — Ambulatory Visit
Admission: EM | Admit: 2022-04-12 | Discharge: 2022-04-12 | Disposition: A | Discharge status: Home / Self Care | Payer: Managed Care, Other (non HMO) | Attending: Internal Medicine | Admitting: Internal Medicine

## 2022-04-12 DIAGNOSIS — M79644 Pain in right finger(s): Secondary | ICD-10-CM

## 2022-04-12 DIAGNOSIS — S62636A Displaced fracture of distal phalanx of right little finger, initial encounter for closed fracture: Secondary | ICD-10-CM

## 2022-04-12 NOTE — ED Triage Notes (Signed)
Pt c/o right 5th digit finger injury after falling at home. Appears either swollen or deformed or possibly both in triage. Onset ~ 6 days.

## 2022-04-12 NOTE — Discharge Instructions (Addendum)
,  The distal aspect of your fifth right finger was fractured there is questionable ligament injury and slight displacement.  Follow-up with Ortho although I am not sure that any further straightening can be done.  Use splint until follow-up.  Take Motrin as needed for pain and swelling

## 2022-04-12 NOTE — ED Provider Notes (Signed)
[facility]   MRN: [id number] DOB: 10/08/1990  Subjective:   Chief Complaint;  Chief Complaint  Patient presents with   right finger injury  Pt c/o right 5th digit finger injury after falling at home. Appears either swollen or deformed or possibly both in triage. Onset ~ 6 days  Alexandria Frank is a 31 y.o. female presenting for right fifth digit deformity and injury status post fall 6 days ago  No current facility-administered medications for this encounter.  Current Outpatient Medications:    acetaminophen (TYLENOL) 500 MG tablet, Take 2 tablets (1,000 mg total) by mouth every 8 (eight) hours as needed for mild pain or fever., Disp: , Rfl:    hydrOXYzine (ATARAX) 25 MG tablet, Take 1 tablet (25 mg total) by mouth every 8 (eight) hours as needed for anxiety., Disp: 60 tablet, Rfl: 2   ibuprofen (ADVIL) 600 MG tablet, Take 1 tablet (600 mg total) by mouth every 8 (eight) hours as needed for moderate pain. Make sure you take with food, Disp: 30 tablet, Rfl: 0   methimazole (TAPAZOLE) 5 MG tablet, Take 1 tablet (5 mg total) by mouth daily., Disp: 120 tablet, Rfl: 3   OVER THE COUNTER MEDICATION, Take 15 mLs by mouth daily. [employer], Disp: , Rfl:    No Known Allergies  Past Medical History:  Diagnosis Date   Anxiety    Thyroid disease      Review of Systems  All other systems reviewed and are negative.    Objective:   Vitals: BP 120/85 (BP Location: Left Arm)   Pulse 80   Temp 99 F (37.2 C) (Oral)   Resp 18   SpO2 99%   Physical Exam Vitals and nursing note reviewed.  Constitutional:      General: She is not in acute distress.    Appearance: Normal appearance. She is normal weight. She is not ill-appearing or toxic-appearing.  HENT:     Head: Normocephalic and atraumatic.     Right Ear: External ear normal.     Left Ear: External ear normal.  Eyes:     Conjunctiva/sclera: Conjunctivae normal.  Pulmonary:     Effort: Pulmonary effort is normal.   Musculoskeletal:     Comments: Right fifth digit deformity at the DIP mild with gross swelling mild angulation palmar no NVD D mild tenderness to palpation limited range of motion due to swelling no open wounds  Neurological:     Mental Status: She is alert.  Psychiatric:        Mood and Affect: Mood normal.        Behavior: Behavior normal.    CLINICAL DATA:  Status post fall with pain.   EXAM: RIGHT LITTLE FINGER 2+V   COMPARISON:  None Available.   FINDINGS: Avulsion fracture off of the base of the right fifth distal phalanx with palmar displacement of the distal fracture fragment. Associated soft tissue swelling.   IMPRESSION: Avulsion fracture off of the base of the right fifth distal phalanx with palmar displacement of the distal fracture fragment. No results found for this or any previous visit (from the past 24 hour(s)).  DG Finger Little Right  Result Date: 04/12/2022 CLINICAL DATA:  Status post fall with pain. EXAM: RIGHT LITTLE FINGER 2+V COMPARISON:  None Available. FINDINGS: Avulsion fracture off of the base of the right fifth distal phalanx with palmar displacement of the distal fracture fragment. Associated soft tissue swelling. IMPRESSION: Avulsion fracture off of the base of the  right fifth distal phalanx with palmar displacement of the distal fracture fragment. Electronically Signed   By: Ted Mcalpine M.D.   On: 04/12/2022 19:11       Assessment and Plan :   1. Closed displaced fracture of distal phalanx of right little finger, initial encounter     No orders of the defined types were placed in this encounter.   MDM:  Alexandria Frank is a 31 y.o. female presenting for finger injury right fifth after fall 6 days ago.  On exam there is swelling noted to the DIP and x-ray shows positive for avulsion fracture concerning for possible ligament injury.  I did not believe I would be able to successfully improve the alignment given this was 6 days ago and it was  a small avulsion injury with amount of swelling around the DIP.  There was no open wound.  The finger was splinted patient will follow-up with Ortho and take Motrin for pain as needed.  Patient is agreeable to this plan.  I discussed todays findings, treatment plan, follow up and return instructions. Questions were answered. Patient/representative stated understanding of the instructions and patient is stable for discharge.   Alexandria Conger FNP-C MSN    Alexandria Baseman, NP 04/12/22 1930

## 2022-04-23 ENCOUNTER — Other Ambulatory Visit: Payer: Self-pay

## 2022-04-23 ENCOUNTER — Encounter (HOSPITAL_COMMUNITY): Payer: Self-pay | Admitting: *Deleted

## 2022-04-23 ENCOUNTER — Emergency Department (HOSPITAL_COMMUNITY)
Admission: EM | Admit: 2022-04-23 | Discharge: 2022-04-23 | Disposition: A | Discharge status: Home / Self Care | Payer: Managed Care, Other (non HMO) | Attending: Emergency Medicine | Admitting: Emergency Medicine

## 2022-04-23 DIAGNOSIS — X58XXXD Exposure to other specified factors, subsequent encounter: Secondary | ICD-10-CM | POA: Insufficient documentation

## 2022-04-23 DIAGNOSIS — S60946D Unspecified superficial injury of right little finger, subsequent encounter: Secondary | ICD-10-CM | POA: Insufficient documentation

## 2022-04-23 DIAGNOSIS — S6991XD Unspecified injury of right wrist, hand and finger(s), subsequent encounter: Secondary | ICD-10-CM

## 2022-04-23 NOTE — ED Triage Notes (Signed)
Patient states she broke her right pinky finger on July 3 and was seen at [facility] on July 11 where they told her it was broken. She was told to follow up but does not have an appointment with Ortho.  Patient presents today to see if she needs surgery on her finger. Finger currently in a splint and is swollen. Patient the swelling is about the same as when she first injured her finger.

## 2022-04-23 NOTE — ED Notes (Signed)
MD brought discharge paperwork but patient had already left the unit. Unable to go over discharge paperwork due to her leaving.

## 2022-04-23 NOTE — Discharge Instructions (Signed)
Please continue to wear finger splint for protection as a broken bone usually takes 4 to 6 weeks to heal.  You may reach out and follow-up with orthopedist that was previously referred for outpatient evaluation.  You may take Tylenol or ibuprofen as needed for pain.

## 2022-04-23 NOTE — ED Provider Notes (Signed)
[facility]-EMERGENCY DEPT Provider Note   CSN: [id number] Arrival date & time: 04/23/22  1239     History  Chief Complaint  Patient presents with   Finger Injury    Right pinky    Alexandria Frank is a 31 y.o. female.  The history is provided by the patient and medical records. No language interpreter was used.    31 year old female presenting complaining of pinky injury.  Patient broke her right pinky finger on July 3.  She was seen at urgent care on July 11 and had x-ray done which she was told that it was broken.  She was giving a number for an orthopedic for follow-up appointment however when she called she was told that her upcoming appointment is not until 2 to 3 weeks.  Patient presenting today asking for additional clarification and treatment.  She still endorse sharp throbbing pain to the affected area with swelling.  Swelling has not increased.  She denies any numbness.  She does have a finger splints that she use on occasion.  She inquired if she will need surgery.  She is right-hand dominant.  Home Medications Prior to Admission medications   Medication Sig Start Date End Date Taking? Authorizing Provider  acetaminophen (TYLENOL) 500 MG tablet Take 2 tablets (1,000 mg total) by mouth every 8 (eight) hours as needed for mild pain or fever. 01/30/22   Juliet Rude, PA-C  hydrOXYzine (ATARAX) 25 MG tablet Take 1 tablet (25 mg total) by mouth every 8 (eight) hours as needed for anxiety. 03/31/22   Claiborne Rigg, NP  ibuprofen (ADVIL) 600 MG tablet Take 1 tablet (600 mg total) by mouth every 8 (eight) hours as needed for moderate pain. Make sure you take with food 01/30/22   Juliet Rude, PA-C  methimazole (TAPAZOLE) 5 MG tablet Take 1 tablet (5 mg total) by mouth daily. 03/31/22   Claiborne Rigg, NP  OVER THE COUNTER MEDICATION Take 15 mLs by mouth daily. [employer]    [provider]      Allergies    Patient has no known allergies.    Review  of Systems   Review of Systems  All other systems reviewed and are negative.   Physical Exam Updated Vital Signs BP 117/76 (BP Location: Left Arm)   Pulse 88   Temp 97.8 F (36.6 C) (Oral)   Resp 16   Ht 5\' 10"  (1.778 m)   Wt 61.2 kg   LMP 04/21/2022 (Exact Date)   SpO2 100%   BMI 19.37 kg/m  Physical Exam Vitals and nursing note reviewed.  Constitutional:      General: She is not in acute distress.    Appearance: She is well-developed.  HENT:     Head: Atraumatic.  Eyes:     Conjunctiva/sclera: Conjunctivae normal.  Pulmonary:     Effort: Pulmonary effort is normal.  Musculoskeletal:        General: Signs of injury (Right pinky finger: Edema and tenderness noted to DIP on palpation, brisk cap refill and no obvious gross deformity) present.     Cervical back: Neck supple.  Skin:    Findings: No rash.  Neurological:     Mental Status: She is alert.  Psychiatric:        Mood and Affect: Mood normal.     ED Results / Procedures / Treatments   Labs (all labs ordered are listed, but only abnormal results are displayed) Labs Reviewed - No data  to display  EKG None  Radiology No results found.  Procedures Procedures    Medications Ordered in ED Medications - No data to display  ED Course/ Medical Decision Making/ A&P                           Medical Decision Making  BP 117/76 (BP Location: Left Arm)   Pulse 88   Temp 97.8 F (36.6 C) (Oral)   Resp 16   Ht 5\' 10"  (1.778 m)   Wt 61.2 kg   LMP 04/21/2022 (Exact Date)   SpO2 100%   BMI 19.37 kg/m   4:05 PM Patient broke her right pinky finger 20 days ago.  She was seen at urgent care 11 days ago for her injury and had x-ray performed which confirmed avulsion fracture at the DIP.  This is a closed injury.  Patient was given finger splint and orthopedic follow-up however she is here today because orthopedic appointment is several weeks from now.  On exam she does have some tenderness and swelling to  the DIP region without gross deformity.  She is neurovascular intact.  I have reviewed previous x-ray of her right pinky finger and from injury.  I have printed out a x-ray picture to give to patient.  I encourage patient to continue to wear finger splint and to follow-up with hand specialist for outpatient care.  Appropriate care instruction provided.  I do not think repeat x-ray is indicated at this time.        Final Clinical Impression(s) / ED Diagnoses Final diagnoses:  Injury of finger of right hand, subsequent encounter    Rx / DC Orders ED Discharge Orders     None         04/23/2022, PA-C 04/23/22 1607    04/25/22, MD 04/23/22 2311

## 2022-05-22 ENCOUNTER — Encounter: Payer: Self-pay | Admitting: Internal Medicine

## 2022-05-22 ENCOUNTER — Ambulatory Visit (INDEPENDENT_AMBULATORY_CARE_PROVIDER_SITE_OTHER): Payer: Managed Care, Other (non HMO) | Admitting: Internal Medicine

## 2022-05-22 VITALS — BP 104/70 | HR 110 | Ht 70.0 in | Wt 146.4 lb

## 2022-05-22 DIAGNOSIS — E0500 Thyrotoxicosis with diffuse goiter without thyrotoxic crisis or storm: Secondary | ICD-10-CM

## 2022-05-22 DIAGNOSIS — Z91199 Patient's noncompliance with other medical treatment and regimen due to unspecified reason: Secondary | ICD-10-CM

## 2022-05-22 DIAGNOSIS — E0590 Thyrotoxicosis, unspecified without thyrotoxic crisis or storm: Secondary | ICD-10-CM

## 2022-05-22 LAB — T3, FREE: T3, Free: 5.5 pg/mL — ABNORMAL HIGH (ref 2.3–4.2)

## 2022-05-22 LAB — T4, FREE: Free T4: 0.61 ng/dL (ref 0.60–1.60)

## 2022-05-22 LAB — TSH: TSH: 5.85 u[IU]/mL — ABNORMAL HIGH (ref 0.35–5.50)

## 2022-05-22 NOTE — Progress Notes (Unsigned)
Patient ID: Alexandria Frank, female   DOB: 16-Sep-1991, 31 y.o.   MRN: [id number]  HPI  Alexandria Frank is a 31 y.o.-year-old female, initially referred by her PCP, Claiborne Rigg, NP, returning for follow-up for persistent Graves' disease despite RAI ablation.  She previously saw Dr. Everardo All, last visit with him 09/15/2020.  Last visit with me 8.5 months ago.  Interim history: Before last visit, she was off and on methimazole.  At that time, she developed itching, nervousness, anxiety.  As of now, she rarely has itching. She takes anxiety medication. She had intussusception in 12/2021 >> no AP.  Reviewed and addended history: Pt. has been dx with Graves' disease in 2021 after she presented with nausea, vomiting, wt loss (25 lbs in 3-4 mo), tremors, palpitations, SOB, and goiter. She had a thyroid uptake and scan (06/02/2020) showing 81.5% uptake in the homogeneous scan.   She was initially on methimazole and Inderal. She had RAI treatment on 06/24/2020. She developed post ablative hypothyroidism afterwards, with a TSH of 5.2 in 08/2020, but she could not tolerate levothyroxine 100 mcg due to return of her symptoms, so she stopped the medication.   In 10/2020, TSH was suppressed off the medication. In 01/2021: TSH was undetectable so we started methimazole 5 mg twice a day She did not return for labs as advised... She was off and on MMI before our visit from 09/2021, and completely off 2 weeks prior to this visit. In 09/2021: TSH was low, so I advised her to start methimazole 5 mg daily. In 12/2021, TSH was normal.   At our visit from 01/2021, she complained of: - + itching - + heat intolerance - + hair loss (new) - no palpitations now, had these initially - no SOB now, had this initially - + insomnia - + anxiety- on Atarax - + hyperdefecation  At our visit from 09/2021, she complained of - itching  - hair thinning  - anxiety but the other symptoms have resolved.  I reviewed pt's thyroid  tests: Lab Results  Component Value Date   TSH 2.642 01/24/2022   TSH 0.12 (L) 09/22/2021   TSH <0.01 (L) 03/01/2021   TSH 0.01 (L) 10/06/2020   TSH 5.20 (H) 08/18/2020   TSH <0.01 (L) 04/19/2020   TSH <0.005 (L) 12/15/2019   FREET4 0.54 (L) 09/22/2021   FREET4 1.70 (H) 03/01/2021   FREET4 1.45 10/06/2020   FREET4 0.20 (L) 08/18/2020   FREET4 3.29 (H) 04/19/2020   FREET4 >7.77 (H) 12/15/2019   T3FREE 2.9 09/22/2021   T3FREE 5.1 (H) 03/01/2021   Lab Results  Component Value Date   T3FREE 2.9 09/22/2021   T3FREE 5.1 (H) 03/01/2021    Antithyroid antibodies: Lab Results  Component Value Date   TSI 491 (H) 03/01/2021    No results found for: "THGAB" No components found for: "TPOAB"  Pt denies feeling nodules in neck, hoarseness, dysphagia/odynophagia.  Pt. also has a history of M, M aunt, MGM.No FH of thyroid cancer.  No h/o radiation tx to head or neck other than RAI tx. No recent use of iodine supplements. No Biotin.  ROS:  + See HPI  Past Medical History:  Diagnosis Date   Anxiety    Thyroid disease    Past Surgical History:  Procedure Laterality Date   COLECTOMY N/A 01/26/2022   Procedure: PARTIAL SIGMOID COLECTOMY;  Surgeon: Darnell Level, MD;  Location: [facility];  Service: General;  Laterality: N/A;   NO PAST SURGERIES  Social History   Socioeconomic History   Marital status: Single    Spouse name: Not on file   Number of children: 0   Years of education: Not on file   Highest education level: Not on file  Occupational History   Occupation: Customer service/call center  Tobacco Use   Smoking status: Never   Smokeless tobacco: Never  Vaping Use   Vaping Use: Never used  Substance and Sexual Activity   Alcohol use: Yes    Comment: tequilla 3 drinks every 2 weeks   Drug use: Yes    Types: Marijuana    Comment: occasional   Sexual activity: Yes  Other Topics Concern   Not on file  Social History Narrative   Not on file   Social Determinants  of Health   Financial Resource Strain: Not on file  Food Insecurity: Not on file  Transportation Needs: Not on file  Physical Activity: Not on file  Stress: Not on file  Social Connections: Not on file  Intimate Partner Violence: Not on file   Current Outpatient Medications on File Prior to Visit  Medication Sig Dispense Refill   acetaminophen (TYLENOL) 500 MG tablet Take 2 tablets (1,000 mg total) by mouth every 8 (eight) hours as needed for mild pain or fever.     hydrOXYzine (ATARAX) 25 MG tablet Take 1 tablet (25 mg total) by mouth every 8 (eight) hours as needed for anxiety. 60 tablet 2   ibuprofen (ADVIL) 600 MG tablet Take 1 tablet (600 mg total) by mouth every 8 (eight) hours as needed for moderate pain. Make sure you take with food 30 tablet 0   methimazole (TAPAZOLE) 5 MG tablet Take 1 tablet (5 mg total) by mouth daily. 120 tablet 3   OVER THE COUNTER MEDICATION Take 15 mLs by mouth daily. [place]     No current facility-administered medications on file prior to visit.   No Known Allergies Family History  Problem Relation Age of Onset   Hyperthyroidism Mother    Hyperthyroidism Maternal Grandmother    Hyperthyroidism Maternal Aunt    PE: BP 104/70 (BP Location: Left Arm, Patient Position: Sitting, Cuff Size: Normal)   Pulse (!) 110   Ht 5\' 10"  (1.778 m)   Wt 146 lb 6.4 oz (66.4 kg)   LMP 04/21/2022 (Exact Date)   SpO2 99%   BMI 21.01 kg/m  Wt Readings from Last 3 Encounters:  05/22/22 146 lb 6.4 oz (66.4 kg)  04/23/22 135 lb (61.2 kg)  03/31/22 147 lb (66.7 kg)   Constitutional: normal weight, in NAD Eyes: EOMI, no exophthalmos ENT: moist mucous membranes, no thyromegaly, no cervical lymphadenopathy Cardiovascular: tachycardia, RR, No MRG Respiratory: CTA B Musculoskeletal: no deformities Skin: moist, warm, no rashes Neurological: no tremor with outstretched hands, DTR normal in all 4  ASSESSMENT: 1.  Graves' disease  2.  Noncompliance with the  recommended plan  PLAN:  1. Patient with history of thyrotoxicosis diagnosed in 2021 when she presented with weight loss, palpitations, tremors, shortness of breath, and she was found to have a goiter.  She was initially started on Inderal and methimazole, but had RAI treatment in 06/2020.  She felt better after this but TSH returned high and she was started on levothyroxine 100 mcg daily.  She did not tolerate this well, with return of her pretreatment symptoms.  At that time, she stopped levothyroxine.  A TSH obtained in 10/2020 was suppressed. -I first saw the patient in 01/2021, when she  was off levothyroxine.  She had weight loss (11 pounds in 6 months), palpitations (heart rate 123 bpm on arrival), anxiety, insomnia.  At that time, we discussed that most likely her RAI treatment did not work and I suggested to add back methimazole.  She felt that this may have caused itching and anxiety, but these symptoms did not resolve even after coming off the medication in the past.  Also, she was on anxiety medication then.  We discussed that thyrotoxicosis itself can cause symptoms like this, not necessarily the treatment, methimazole.  We also discussed about the possibility of repeating RAI treatment, but she wanted to give methimazole another try.  I advised her to take methimazole 5 mg daily.  She continues on this dose now. -At today's visit, she is feeling well, without thyrotoxic symptoms.  She mentions that she only occasionally has itching now, but her anxiety persists -on treatment. -Upon questioning, patient is contemplating a pregnancy.  We discussed about the need for perfect thyroid tests before the pregnancy and also, if a thionamide is needed, to change from methimazole to PTU in the first trimester.  Therefore, I advised her to let me know as soon as possible if she becomes pregnant, to check her thyroid labs and change her medication.  She agrees to do so. -We will recheck her TFTs: TSH, free  T4, free T3 and change her methimazole dose accordingly -I will see her back in 6 months but possibly with labs sooner  2. Noncompliance with the recommended treatment plan -At last visit, patient returned after a longer period of time, without having had labs as recommended and without taking methimazole consistently -At last visit, we discussed about possible consequences of uncontrolled thyrotoxicosis to include strokes/heart attacks/sudden death -We discussed that every time we change her methimazole dose or make any changes in her regimen, will need to see her back for labs in 4 to 6 weeks. -She agreed with the plan.  Needs refills.  Component     Latest Ref Rng 05/22/2022  TSH     0.35 - 5.50 uIU/mL 5.85 (H)   T4,Free(Direct)     0.60 - 1.60 ng/dL 1.60   Triiodothyronine,Free,Serum     2.3 - 4.2 pg/mL 5.5 (H)   TSI     <140 % baseline 223 (H)     Graves' antibodies are still above target, but improved since last check. Her TSH is elevated, so we need to back off the methimazole to 5 mg every other day and recheck her test in 1.5 months.  Carlus Pavlov, MD PhD [facility] Endocrinology

## 2022-05-22 NOTE — Patient Instructions (Signed)
Please continue: - Methimazole 5 mg in am  Please stop at the lab.  Please come back for a follow-up appointment in 6 months.

## 2022-05-25 LAB — THYROID STIMULATING IMMUNOGLOBULIN: TSI: 223 % baseline — ABNORMAL HIGH (ref <140)

## 2022-05-25 MED ORDER — METHIMAZOLE 5 MG PO TABS: 5.0000 mg | ORAL_TABLET | ORAL | 3 refills | Status: DC

## 2022-05-26 ENCOUNTER — Encounter: Payer: Self-pay | Admitting: Nurse Practitioner

## 2022-06-27 ENCOUNTER — Encounter: Payer: Self-pay | Admitting: Internal Medicine

## 2022-07-11 ENCOUNTER — Ambulatory Visit: Payer: Managed Care, Other (non HMO) | Admitting: Internal Medicine

## 2022-07-11 NOTE — Progress Notes (Deleted)
Patient ID: Alexandria Frank, female   DOB: 1991/08/25, 31 y.o.   MRN: [id number]  HPI  Alexandria Frank is a 31 y.o.-year-old female, initially referred by her PCP, Claiborne Rigg, NP, returning for follow-up for persistent Graves' disease despite RAI ablation.  She previously saw Dr. Everardo All, last visit with him 09/15/2020.  Last visit with me 1.5 months ago.  Interim history: In the past, she was off and on methimazole.  She added back methimazole 09/2021 and decrease the dose at last visit. She did have itching and anxiety, unclear if related to methimazole.  She is on anxiety medication.  Reviewed and addended history: Pt. has been dx with Graves' disease in 2021 after she presented with nausea, vomiting, wt loss (25 lbs in 3-4 mo), tremors, palpitations, SOB, and goiter. She had a thyroid uptake and scan (06/02/2020) showing 81.5% uptake in the homogeneous scan.   She was initially on methimazole and Inderal. She had RAI treatment on 06/24/2020. She developed post ablative hypothyroidism afterwards, with a TSH of 5.2 in 08/2020, but she could not tolerate levothyroxine 100 mcg due to return of her symptoms, so she stopped the medication.   In 10/2020, TSH was suppressed off the medication. In 01/2021: TSH was undetectable so we started methimazole 5 mg twice a day She did not return for labs as advised... She was off and on MMI before our visit from 09/2021, and completely off 2 weeks prior to this visit. In 09/2021: TSH was low, so I advised her to start methimazole 5 mg daily. In 12/2021, TSH was normal.  In 05/2022, TSH was high so we decreased the dose of methimazole to 2.5 mg daily.  At our visit from 01/2021, she complained of: - + itching - + heat intolerance - + hair loss (new) - no palpitations now, had these initially - no SOB now, had this initially - + insomnia - + anxiety- on Atarax - + hyperdefecation  At our visit from 09/2021, she complained of - itching  - hair thinning   - anxiety but the other symptoms have resolved.  I reviewed pt's thyroid tests: Lab Results  Component Value Date   TSH 5.85 (H) 05/22/2022   TSH 2.642 01/24/2022   TSH 0.12 (L) 09/22/2021   TSH <0.01 (L) 03/01/2021   TSH 0.01 (L) 10/06/2020   TSH 5.20 (H) 08/18/2020   TSH <0.01 (L) 04/19/2020   TSH <0.005 (L) 12/15/2019   FREET4 0.61 05/22/2022   FREET4 0.54 (L) 09/22/2021   FREET4 1.70 (H) 03/01/2021   FREET4 1.45 10/06/2020   FREET4 0.20 (L) 08/18/2020   FREET4 3.29 (H) 04/19/2020   FREET4 >7.77 (H) 12/15/2019   T3FREE 5.5 (H) 05/22/2022   T3FREE 2.9 09/22/2021   T3FREE 5.1 (H) 03/01/2021   Lab Results  Component Value Date   T3FREE 5.5 (H) 05/22/2022   T3FREE 2.9 09/22/2021   T3FREE 5.1 (H) 03/01/2021    Antithyroid antibodies: Lab Results  Component Value Date   TSI 223 (H) 05/22/2022   TSI 491 (H) 03/01/2021    No results found for: "THGAB" No components found for: "TPOAB"  Pt denies feeling nodules in neck, hoarseness, dysphagia/odynophagia.  Pt. also has a history of M, M aunt, MGM.No FH of thyroid cancer.  No h/o radiation tx to head or neck other than RAI tx. No recent use of iodine supplements. No Biotin.  ROS:  + See HPI  Past Medical History:  Diagnosis Date   Anxiety  Thyroid disease    Past Surgical History:  Procedure Laterality Date   COLECTOMY N/A 01/26/2022   Procedure: PARTIAL SIGMOID COLECTOMY;  Surgeon: Armandina Gemma, MD;  Location: [facility];  Service: General;  Laterality: N/A;   NO PAST SURGERIES     Social History   Socioeconomic History   Marital status: Single    Spouse name: Not on file   Number of children: 0   Years of education: Not on file   Highest education level: Not on file  Occupational History   Occupation: Customer service/call center  Tobacco Use   Smoking status: Never   Smokeless tobacco: Never  Vaping Use   Vaping Use: Never used  Substance and Sexual Activity   Alcohol use: Yes    Comment:  tequilla 3 drinks every 2 weeks   Drug use: Yes    Types: Marijuana    Comment: occasional   Sexual activity: Yes  Other Topics Concern   Not on file  Social History Narrative   Not on file   Social Determinants of Health   Financial Resource Strain: Not on file  Food Insecurity: Not on file  Transportation Needs: Not on file  Physical Activity: Not on file  Stress: Not on file  Social Connections: Not on file  Intimate Partner Violence: Not on file   Current Outpatient Medications on File Prior to Visit  Medication Sig Dispense Refill   hydrOXYzine (ATARAX) 25 MG tablet Take 1 tablet (25 mg total) by mouth every 8 (eight) hours as needed for anxiety. 60 tablet 2   methimazole (TAPAZOLE) 5 MG tablet Take 1 tablet (5 mg total) by mouth every other day. 45 tablet 3   No current facility-administered medications on file prior to visit.   No Known Allergies Family History  Problem Relation Age of Onset   Hyperthyroidism Mother    Hyperthyroidism Maternal Grandmother    Hyperthyroidism Maternal Aunt    PE: There were no vitals taken for this visit. Wt Readings from Last 3 Encounters:  05/22/22 146 lb 6.4 oz (66.4 kg)  04/23/22 135 lb (61.2 kg)  03/31/22 147 lb (66.7 kg)   Constitutional: normal weight, in NAD Eyes:  EOMI, no exophthalmos ENT: no neck masses, no cervical lymphadenopathy Cardiovascular: RRR, No MRG Respiratory: CTA B Musculoskeletal: no deformities Skin:no rashes Neurological: no tremor with outstretched hands  ASSESSMENT: 1.  Graves' disease  2.  Noncompliance with the recommended plan  PLAN:  1. Patient with history of thyrotoxicosis diagnosed in 2021 when she presented with weight loss, palpitations, tremors, shortness of breath, and she was found to have a goiter.  She was initially started on Inderal and methimazole, but had RAI treatment in 06/2020.  She felt better after this but TSH returned high and she was started on levothyroxine 100 mcg  daily.  She did not tolerate this well, with return of her pretreatment symptoms.  At that time, she stopped levothyroxine.  A TSH obtained in 10/2020 was suppressed. -I first saw the patient in 01/2021, when she was off levothyroxine.  She had weight loss (11 pounds in 6 months), palpitations (heart rate 123 bpm on arrival), anxiety, insomnia.  At that time, we discussed that most likely her RAI treatment did not work and I suggested to add back methimazole.  She felt that this may have caused itching and anxiety, but these symptoms did not resolve even after coming off the medication in the past.  Also, she was on anxiety medication then.  We discussed that thyrotoxicosis itself can cause symptoms like this, not necessarily the treatment, methimazole.  We also discussed about the possibility of repeating RAI treatment, but she wanted to give methimazole another try.  I advised her to take methimazole 5 mg daily.  At last visit, she was on this dose. -At last visit, she was feeling well, without thyrotoxic symptoms, only with occasional itching.  She continues to have anxiety but was on treatment.  She mentions that she was contemplating a pregnancy. -At today's visit, she still has some itching and anxiety, but otherwise feels well.  We again discussed about the fact that we need to get her thyroid tests completely under control before getting pregnant.  Also, we need to change her methimazole to PTU in the first trimester, if there is still need for thionamides during the pregnancy.  I also advised her to let me know as soon as she becomes pregnant, to check her thyroid labs and change her medications.  She agrees to do so. -Of note, at last visit, TSI antibodies were still elevated (although improved), so it is unlikely that we can stop thionamides in the near future.  We will keep an eye on her TSI's. -At today's visit we will check a TSH, free T4, free T3 and change her methimazole dose accordingly. -I  will see her back in 3 to 4 months.  2. Noncompliance with the recommended treatment plan -At last visit, patient returned after a longer period of time, without having had labs as recommended and without taking methimazole consistently -We previously discussed about possible consequences of uncontrolled thyrotoxicosis to include strokes/heart attacks/sudden death -We discussed that every time we change her methimazole dose or make any other changes in her regimen, we will need to see her back for labs in 4 to 6 weeks -Today's visit, she returns 1.5 months after the previous visit, as advised.  Carlus Pavlov, MD PhD [facility] Endocrinology

## 2022-08-07 ENCOUNTER — Encounter: Payer: Self-pay | Admitting: Internal Medicine

## 2022-08-07 ENCOUNTER — Other Ambulatory Visit (INDEPENDENT_AMBULATORY_CARE_PROVIDER_SITE_OTHER): Payer: Managed Care, Other (non HMO)

## 2022-08-07 ENCOUNTER — Ambulatory Visit (INDEPENDENT_AMBULATORY_CARE_PROVIDER_SITE_OTHER): Payer: Managed Care, Other (non HMO) | Admitting: Internal Medicine

## 2022-08-07 VITALS — BP 120/68 | HR 105 | Ht 70.0 in | Wt 145.0 lb

## 2022-08-07 DIAGNOSIS — Z86010 Personal history of colonic polyps: Secondary | ICD-10-CM

## 2022-08-07 DIAGNOSIS — K649 Unspecified hemorrhoids: Secondary | ICD-10-CM

## 2022-08-07 DIAGNOSIS — D649 Anemia, unspecified: Secondary | ICD-10-CM | POA: Diagnosis not present

## 2022-08-07 LAB — IBC + FERRITIN
Ferritin: 3.2 ng/mL — ABNORMAL LOW (ref 10.0–291.0)
Iron: 18 ug/dL — ABNORMAL LOW (ref 42–145)
Saturation Ratios: 4.1 % — ABNORMAL LOW (ref 20.0–50.0)
TIBC: 434 ug/dL (ref 250.0–450.0)
Transferrin: 310 mg/dL (ref 212.0–360.0)

## 2022-08-07 LAB — B12 AND FOLATE PANEL
Folate: 11.6 ng/mL (ref >5.9)
Vitamin B-12: 333 pg/mL (ref 211–911)

## 2022-08-07 MED ORDER — NA SULFATE-K SULFATE-MG SULF 17.5-3.13-1.6 GM/177ML PO SOLN: 1.0000 | ORAL | 0 refills | Status: DC

## 2022-08-07 MED ORDER — HYDROCORTISONE (PERIANAL) 2.5 % EX CREA: 1.0000 | TOPICAL_CREAM | Freq: Two times a day (BID) | CUTANEOUS | 1 refills | Status: DC

## 2022-08-07 MED ORDER — ONDANSETRON HCL 4 MG PO TABS: ORAL_TABLET | ORAL | 0 refills | Status: DC

## 2022-08-07 NOTE — Patient Instructions (Addendum)
_______________________________________________________  If you are age 29 or older, your body mass index should be between 23-30. Your Body mass index is 20.81 kg/m. If this is out of the aforementioned range listed, please consider follow up with your Primary Care Provider.  If you are age 48 or younger, your body mass index should be between 19-25. Your Body mass index is 20.81 kg/m. If this is out of the aformentioned range listed, please consider follow up with your Primary Care Provider.   ________________________________________________________  The [place] [facility] providers would like to encourage you to use [facility] to communicate with providers for non-urgent requests or questions.  Due to long hold times on the telephone, sending your provider a message by [facility] may be a faster and more efficient way to get a response.  Please allow 48 business hours for a response.  Please remember that this is for non-urgent requests.   Your provider has requested that you go to the basement level for lab work before leaving today. Press "B" on the elevator. The lab is located at the first door on the left as you exit the elevator.   You have been scheduled for a colonoscopy. Please follow written instructions given to you at your visit today.  Please pick up your prep supplies at the pharmacy within the next 1-3 days. If you use inhalers (even only as needed), please bring them with you on the day of your procedure.   Due to recent changes in healthcare laws, you may see the results of your imaging and laboratory studies on MyChart before your provider has had a chance to review them.  We understand that in some cases there may be results that are confusing or concerning to you. Not all laboratory results come back in the same time frame and the provider may be waiting for multiple results in order to interpret others.  Please give [place] 48 hours in order for your provider to thoroughly review all the  results before contacting the office for clarification of your results.    We have sent the following medications to your pharmacy for you to pick up at your convenience:  Start Anusol cream apply to rectum twice daily for 7 days Use zofran during colonoscopy prep  ____________________________________________________ You have been scheduled for a colonoscopy. Please follow written instructions given to you at your visit today.  Please pick up your prep supplies at the pharmacy within the next 1-3 days. If you use inhalers (even only as needed), please bring them with you on the day of your procedure.

## 2022-08-07 NOTE — Progress Notes (Signed)
Chief Complaint: History of colon polyps  HPI: 31 year old female with history of Graves disease, rectosigmoid intussusception 2/2 large colonic polyp s/p partial colectomy in 12/2025, anxiety presents to discuss colonoscopy  She was admitted in 12/2021 for rectosigmoid intussusception that was treated with sigmoid resection. During surgery it was discovered that her intussusception was due to a large colon polyp with pathology that came back as a large TVA. Denies family history of colon cancer or advanced polyps. Denies melena or hematochezia. She occasional feels constipated. She has one BM on average once every 1-2 days. Weight has been stable. She does still have menstrual periods. Denies N&V, dysphagia, or ab pain. She had some issues with acid reflux after the surgery but these have gotten better. Has occasional reflux still that is not that bothersome. Denies rectal pain.  Wt Readings from Last 3 Encounters:  08/07/22 145 lb (65.8 kg)  05/22/22 146 lb 6.4 oz (66.4 kg)  04/23/22 135 lb (61.2 kg)   Past Medical History:  Diagnosis Date   Anxiety    Thyroid disease    Past Surgical History:  Procedure Laterality Date   COLECTOMY N/A 01/26/2022   Procedure: PARTIAL SIGMOID COLECTOMY;  Surgeon: Darnell Level, MD;  Location: [facility];  Service: General;  Laterality: N/A;   NO PAST SURGERIES     Family History  Problem Relation Age of Onset   Hyperthyroidism Mother    Hyperthyroidism Maternal Aunt    Hyperthyroidism Maternal Grandmother    Liver cancer Neg Hx    Esophageal cancer Neg Hx    Colon polyps Neg Hx    Social History   Tobacco Use   Smoking status: Never   Smokeless tobacco: Never  Vaping Use   Vaping Use: Never used  Substance Use Topics   Alcohol use: Yes    Comment: tequilla 3 drinks every 2 weeks   Drug use: Yes    Types: Marijuana    Comment: occasional   Current Outpatient Medications  Medication Sig Dispense Refill   hydrOXYzine (ATARAX) 25 MG tablet  Take 1 tablet (25 mg total) by mouth every 8 (eight) hours as needed for anxiety. 60 tablet 2   methimazole (TAPAZOLE) 5 MG tablet Take 1 tablet (5 mg total) by mouth every other day. 45 tablet 3   No current facility-administered medications for this visit.   No Known Allergies   Review of Systems: All systems reviewed and negative except where noted in HPI.   Physical Exam: BP 120/68   Pulse (!) 105   Ht 5\' 10"  (1.778 m)   Wt 145 lb (65.8 kg)   SpO2 98%   BMI 20.81 kg/m  Constitutional: Pleasant,well-developed, female in no acute distress. HEENT: Normocephalic and atraumatic. Conjunctivae are normal. No scleral icterus. Cardiovascular: Normal rate, regular rhythm.  Pulmonary/chest: Effort normal and breath sounds normal. No wheezing, rales or rhonchi. Abdominal: Soft, nondistended, nontender. Bowel sounds active throughout. There are no masses palpable. No hepatomegaly. Extremities: No edema Neurological: Alert and oriented to person place and time. Skin: Skin is warm and dry. No rashes noted. Psychiatric: Normal mood and affect. Behavior is normal.  Labs 12/2021: CBC with low Hb of 8.6 (MCV 73.3) and elevated WBC of 12.1. BMP nml.   Labs 05/2022: TSH elevated at 5.85. FT4 nml. FT3 elevated at 5.5. TSI elevated at 223.   CT A/P w/contrast 01/24/22: IMPRESSION: Long segment of intussusception involving the rectosigmoid colon with invagination of mesentery and vasculature. No bowel obstruction or  free air. Surgical consultation is recommended  ASSESSMENT AND PLAN: History of tubulovillous adenoma Anemia Hemorrhoids Patient presents to discuss a colonoscopy for colon cancer screening. She was recently found in 12/2021 to have rectosigmoid intussusception due to a large TVA. Will plan to perform a colonoscopy to look for any additional colon polyps. Patient has had longstanding anemia, perhaps due to a combination of menstrual periods and hyperthyroidism. Will plan to do a basic  anemia work up to see if the patient would benefit from iron supplements. For presumed hemorrhoids, will start the patient on some topical steroids/ - Check ferritin/IBC, vitamin B12, folate - Anusol HC cream BID for 7 days - Zofran 4 mg PRN for nausea with prep - Colonoscopy [facility]  Christia Reading, MD

## 2022-10-09 ENCOUNTER — Encounter: Payer: Self-pay | Admitting: Internal Medicine

## 2022-10-16 ENCOUNTER — Ambulatory Visit (AMBULATORY_SURGERY_CENTER): Payer: Managed Care, Other (non HMO) | Admitting: Internal Medicine

## 2022-10-16 ENCOUNTER — Encounter: Payer: Self-pay | Admitting: Internal Medicine

## 2022-10-16 VITALS — BP 132/84 | HR 74 | Temp 98.3°F | Resp 20 | Ht 70.0 in | Wt 145.0 lb

## 2022-10-16 DIAGNOSIS — K635 Polyp of colon: Secondary | ICD-10-CM

## 2022-10-16 DIAGNOSIS — Z09 Encounter for follow-up examination after completed treatment for conditions other than malignant neoplasm: Secondary | ICD-10-CM | POA: Diagnosis not present

## 2022-10-16 DIAGNOSIS — Z86010 Personal history of colonic polyps: Secondary | ICD-10-CM

## 2022-10-16 DIAGNOSIS — K6389 Other specified diseases of intestine: Secondary | ICD-10-CM

## 2022-10-16 MED ORDER — SODIUM CHLORIDE 0.9 % IV SOLN: 500.0000 mL | INTRAVENOUS | Status: DC

## 2022-10-16 NOTE — Progress Notes (Signed)
Called to room to assist during endoscopic procedure.  Patient ID and intended procedure confirmed with present staff. Received instructions for my participation in the procedure from the performing physician.  

## 2022-10-16 NOTE — Progress Notes (Signed)
Sedate, gd SR, tolerated procedure well, VSS, report to RN 

## 2022-10-16 NOTE — Patient Instructions (Signed)
   HANDOUT ON HEMORRHOIDS GIVEN TO YOU TODAY   AWAIT BIOPSIES OF COLON RESULTS    YOU HAD AN ENDOSCOPIC PROCEDURE TODAY AT [facility]:   Refer to the procedure report that was given to you for any specific questions about what was found during the examination.  If the procedure report does not answer your questions, please call your gastroenterologist to clarify.  If you requested that your care partner not be given the details of your procedure findings, then the procedure report has been included in a sealed envelope for you to review at your convenience later.  YOU SHOULD EXPECT: Some feelings of bloating in the abdomen. Passage of more gas than usual.  Walking can help get rid of the air that was put into your GI tract during the procedure and reduce the bloating. If you had a lower endoscopy (such as a colonoscopy or flexible sigmoidoscopy) you may notice spotting of blood in your stool or on the toilet paper. If you underwent a bowel prep for your procedure, you may not have a normal bowel movement for a few days.  Please Note:  You might notice some irritation and congestion in your nose or some drainage.  This is from the oxygen used during your procedure.  There is no need for concern and it should clear up in a day or so.  SYMPTOMS TO REPORT IMMEDIATELY:  Following lower endoscopy (colonoscopy or flexible sigmoidoscopy):  Excessive amounts of blood in the stool  Significant tenderness or worsening of abdominal pains  Swelling of the abdomen that is new, acute  Fever of 100F or higher   For urgent or emergent issues, a gastroenterologist can be reached at any hour by calling [phone]. Do not use MyChart messaging for urgent concerns.    DIET:  We do recommend a small meal at first, but then you may proceed to your regular diet.  Drink plenty of fluids but you should avoid alcoholic beverages for 24 hours.  ACTIVITY:  You should plan to take it easy for the  rest of today and you should NOT DRIVE or use heavy machinery until tomorrow (because of the sedation medicines used during the test).    FOLLOW UP: Our staff will call the number listed on your records the next business day following your procedure.  We will call around 7:15- 8:00 am to check on you and address any questions or concerns that you may have regarding the information given to you following your procedure. If we do not reach you, we will leave a message.     If any biopsies were taken you will be contacted by phone or by letter within the next 1-3 weeks.  Please call us at [phone] if you have not heard about the biopsies in 3 weeks.    SIGNATURES/CONFIDENTIALITY: You and/or your care partner have signed paperwork which will be entered into your electronic medical record.  These signatures attest to the fact that that the information above on your After Visit Summary has been reviewed and is understood.  Full responsibility of the confidentiality of this discharge information lies with you and/or your care-partner.

## 2022-10-16 NOTE — Progress Notes (Signed)
GASTROENTEROLOGY PROCEDURE H&P NOTE   Primary Care Physician: Gildardo Pounds, NP    Reason for Procedure:   History of colon polyps, IDA  Plan:    Colonoscopy  Patient is appropriate for endoscopic procedure(s) in the ambulatory ([place]) setting.  The nature of the procedure, as well as the risks, benefits, and alternatives were carefully and thoroughly reviewed with the patient. Ample time for discussion and questions allowed. The patient understood, was satisfied, and agreed to proceed.     HPI: Alexandria Frank is a 32 y.o. female who presents for colonoscopy for evaluation of history of colon polyps .  Patient was most recently seen in the Gastroenterology Clinic on 08/07/22.  No interval change in medical history since that appointment. Please refer to that note for full details regarding GI history and clinical presentation.   Past Medical History:  Diagnosis Date   Anemia    Anxiety    Thyroid disease     Past Surgical History:  Procedure Laterality Date   COLECTOMY N/A 01/26/2022   Procedure: PARTIAL SIGMOID COLECTOMY;  Surgeon: Armandina Gemma, MD;  Location: [facility];  Service: General;  Laterality: N/A;   NO PAST SURGERIES      Prior to Admission medications   Medication Sig Start Date End Date Taking? Authorizing Provider  hydrOXYzine (ATARAX) 25 MG tablet Take 1 tablet (25 mg total) by mouth every 8 (eight) hours as needed for anxiety. 03/31/22  Yes Gildardo Pounds, NP  methimazole (TAPAZOLE) 5 MG tablet Take 1 tablet (5 mg total) by mouth every other day. 05/25/22  Yes Philemon Kingdom, MD  Na Sulfate-K Sulfate-Mg Sulf 17.5-3.13-1.6 GM/177ML SOLN Take 1 kit by mouth as directed. 08/07/22  Yes Sharyn Creamer, MD  clobetasol ointment (TEMOVATE) 3.79 % Apply 1 Application topically as needed.    [provider]  hydrocortisone (ANUSOL-HC) 2.5 % rectal cream Place 1 Application rectally 2 (two) times daily. For 7 days Patient not taking: Reported on 10/16/2022  08/07/22   Sharyn Creamer, MD  ondansetron [facility]) 4 MG tablet Take 1-2 tablets 30 minutes prior to each bowel prep 08/07/22   Sharyn Creamer, MD    Current Outpatient Medications  Medication Sig Dispense Refill   hydrOXYzine (ATARAX) 25 MG tablet Take 1 tablet (25 mg total) by mouth every 8 (eight) hours as needed for anxiety. 60 tablet 2   methimazole (TAPAZOLE) 5 MG tablet Take 1 tablet (5 mg total) by mouth every other day. 45 tablet 3   Na Sulfate-K Sulfate-Mg Sulf 17.5-3.13-1.6 GM/177ML SOLN Take 1 kit by mouth as directed. 354 mL 0   clobetasol ointment (TEMOVATE) 0.24 % Apply 1 Application topically as needed.     hydrocortisone (ANUSOL-HC) 2.5 % rectal cream Place 1 Application rectally 2 (two) times daily. For 7 days (Patient not taking: Reported on 10/16/2022) 30 g 1   ondansetron (ZOFRAN) 4 MG tablet Take 1-2 tablets 30 minutes prior to each bowel prep 4 tablet 0   Current Facility-Administered Medications  Medication Dose Route Frequency Provider Last Rate Last Admin   0.9 %  sodium chloride infusion  500 mL Intravenous Continuous Sharyn Creamer, MD        Allergies as of 10/16/2022   (No Known Allergies)    Family History  Problem Relation Age of Onset   Hyperthyroidism Mother    Hyperthyroidism Maternal Aunt    Hyperthyroidism Maternal Grandmother    Liver cancer Neg Hx    Esophageal cancer Neg  Hx    Colon polyps Neg Hx     Social History   Socioeconomic History   Marital status: Single    Spouse name: Not on file   Number of children: 0   Years of education: Not on file   Highest education level: Not on file  Occupational History   Occupation: Customer service/call center  Tobacco Use   Smoking status: Never   Smokeless tobacco: Never  Vaping Use   Vaping Use: Never used  Substance and Sexual Activity   Alcohol use: Yes    Comment: tequilla 3 drinks every 2 weeks   Drug use: Yes    Types: Marijuana    Comment: occasional   Sexual activity: Yes   Other Topics Concern   Not on file  Social History Narrative   Not on file   Social Determinants of Health   Financial Resource Strain: Not on file  Food Insecurity: Not on file  Transportation Needs: Not on file  Physical Activity: Not on file  Stress: Not on file  Social Connections: Not on file  Intimate Partner Violence: Not on file    Physical Exam: Vital signs in last 24 hours: BP 114/77   Pulse 100   Temp 98.3 F (36.8 C) (Temporal)   Ht 5\' 10"  (1.778 m)   Wt 145 lb (65.8 kg)   LMP 10/01/2022 (Exact Date)   SpO2 100%   BMI 20.81 kg/m  GEN: NAD EYE: Sclerae anicteric ENT: MMM CV: Non-tachycardic Pulm: No increased WOB GI: Soft NEURO:  Alert & Oriented   Christia Reading, MD [facility]   10/16/2022 11:40 AM

## 2022-10-16 NOTE — Op Note (Signed)
[facility] Patient Name: Alexandria Frank Procedure Date: 10/16/2022 11:30 AM MRN: [id number] Endoscopist: Particia Lather , , [id number] Age: 32 Referring MD:  Date of Birth: 01-20-1991 Gender: Female Account #: [id number] Procedure:                Colonoscopy Indications:              High risk colon cancer surveillance: Personal                            history of colonic polyps Medicines:                Monitored Anesthesia Care Procedure:                Pre-Anesthesia Assessment:                           - Prior to the procedure, a History and Physical                            was performed, and patient medications and                            allergies were reviewed. The patient's tolerance of                            previous anesthesia was also reviewed. The risks                            and benefits of the procedure and the sedation                            options and risks were discussed with the patient.                            All questions were answered, and informed consent                            was obtained. Prior Anticoagulants: The patient has                            taken no anticoagulant or antiplatelet agents. ASA                            Grade Assessment: II - A patient with mild systemic                            disease. After reviewing the risks and benefits,                            the patient was deemed in satisfactory condition to                            undergo the procedure.  After obtaining informed consent, the colonoscope                            was passed under direct vision. Throughout the                            procedure, the patient's blood pressure, pulse, and                            oxygen saturations were monitored continuously. The                            Olympus CF-HQ190L ([id number]) Colonoscope was                            introduced through the anus and  advanced to the the                            terminal ileum. The colonoscopy was performed                            without difficulty. The patient tolerated the                            procedure well. The quality of the bowel                            preparation was good. The terminal ileum, ileocecal                            valve, appendiceal orifice, and rectum were                            photographed. Scope In: 11:54:34 AM Scope Out: 12:09:32 PM Total Procedure Duration: 0 hours 14 minutes 58 seconds  Findings:                 The terminal ileum appeared normal.                           A localized area of mildly erythematous mucosa was                            found in the descending colon. This was biopsied                            with a cold forceps for histology.                           There was evidence of a prior end-to-end                            colo-colonic anastomosis in the sigmoid colon. This  was patent and was characterized by healthy                            appearing mucosa. The anastomosis was traversed.                           Non-bleeding internal hemorrhoids were found during                            retroflexion. Complications:            No immediate complications. Estimated Blood Loss:     Estimated blood loss was minimal. Impression:               - The examined portion of the ileum was normal.                           - Erythematous mucosa in the descending colon.                            Biopsied.                           - Patent end-to-end colo-colonic anastomosis,                            characterized by healthy appearing mucosa.                           - Non-bleeding internal hemorrhoids. Recommendation:           - Discharge patient to home (with escort).                           - Await pathology results.                           - Repeat colonoscopy in 5 years for surveillance.                            - The findings and recommendations were discussed                            with the patient. Dr Georgian Co "[name]" Lorenso Courier,  10/16/2022 12:18:29 PM

## 2022-10-17 ENCOUNTER — Telehealth: Payer: Self-pay | Admitting: *Deleted

## 2022-10-17 NOTE — Telephone Encounter (Signed)
  Follow up Call-     10/16/2022   10:49 AM  Call back number  Post procedure Call Back phone  # [phone]  Permission to leave phone message Yes     Patient questions:  Do you have a fever, pain , or abdominal swelling? No. Pain Score  0 *  Have you tolerated food without any problems? Yes.    Have you been able to return to your normal activities? Yes.    Do you have any questions about your discharge instructions: Diet   No. Medications  No. Follow up visit  No.  Do you have questions or concerns about your Care? No.  Actions: * If pain score is 4 or above: No action needed, pain <4.

## 2022-10-19 ENCOUNTER — Encounter: Payer: Self-pay | Admitting: Internal Medicine

## 2022-11-09 ENCOUNTER — Encounter: Payer: Self-pay | Admitting: Nurse Practitioner

## 2022-11-09 ENCOUNTER — Encounter: Payer: Self-pay | Admitting: Internal Medicine

## 2022-11-13 ENCOUNTER — Ambulatory Visit: Payer: Managed Care, Other (non HMO) | Admitting: Internal Medicine

## 2022-11-13 NOTE — Progress Notes (Deleted)
Patient ID: Alexandria Frank, female   DOB: 1991-07-23, 32 y.o.   MRN: [id number]  HPI  Alexandria Frank is a 32 y.o.-year-old female, initially referred by her PCP, Gildardo Pounds, NP, returning for follow-up for persistent Graves' disease despite RAI ablation.  She previously saw Dr. Loanne Drilling, last visit with him 09/15/2020.  Last visit with me 6 months ago.  Interim history: She denies heat intolerance, palpitations, tremors. As of now, she rarely has itching. She takes anxiety medication.  Reviewed and addended history: Pt. has been dx with Graves' disease in 2021 after she presented with nausea, vomiting, wt loss (25 lbs in 3-4 mo), tremors, palpitations, SOB, and goiter. She had a thyroid uptake and scan (06/02/2020) showing 81.5% uptake in the homogeneous scan.   She was initially on methimazole and Inderal. She had RAI treatment on 06/24/2020. She developed post ablative hypothyroidism afterwards, with a TSH of 5.2 in 08/2020, but she could not tolerate levothyroxine 100 mcg due to return of her symptoms, so she stopped the medication.   In 10/2020, TSH was suppressed off the medication. In 01/2021: TSH was undetectable so we started methimazole 5 mg twice a day She did not return for labs as advised... She was off and on MMI before our visit from 09/2021, and completely off 2 weeks prior to this visit. In 09/2021: TSH was low, so I advised her to start methimazole 5 mg daily. In 12/2021, TSH was normal.   At our visit from 01/2021, she complained of: - + itching - + heat intolerance - + hair loss (new) - no palpitations now, had these initially - no SOB now, had this initially - + insomnia - + anxiety- on Atarax - + hyperdefecation  At our visit from 09/2021, she complained of - itching  - hair thinning  - anxiety but the other symptoms have resolved.  I reviewed pt's thyroid tests: Lab Results  Component Value Date   TSH 5.85 (H) 05/22/2022   TSH 2.642 01/24/2022   TSH 0.12  (L) 09/22/2021   TSH <0.01 (L) 03/01/2021   TSH 0.01 (L) 10/06/2020   TSH 5.20 (H) 08/18/2020   TSH <0.01 (L) 04/19/2020   TSH <0.005 (L) 12/15/2019   FREET4 0.61 05/22/2022   FREET4 0.54 (L) 09/22/2021   FREET4 1.70 (H) 03/01/2021   FREET4 1.45 10/06/2020   FREET4 0.20 (L) 08/18/2020   FREET4 3.29 (H) 04/19/2020   FREET4 >7.77 (H) 12/15/2019   T3FREE 5.5 (H) 05/22/2022   T3FREE 2.9 09/22/2021   T3FREE 5.1 (H) 03/01/2021   Lab Results  Component Value Date   T3FREE 5.5 (H) 05/22/2022   T3FREE 2.9 09/22/2021   T3FREE 5.1 (H) 03/01/2021    Antithyroid antibodies: Lab Results  Component Value Date   TSI 223 (H) 05/22/2022   TSI 491 (H) 03/01/2021    No results found for: "THGAB" No components found for: "TPOAB"  Pt denies feeling nodules in neck, hoarseness, dysphagia/odynophagia.  Pt. also has a history of M, M aunt, MGM.No FH of thyroid cancer.  No h/o radiation tx to head or neck other than RAI tx. No recent use of iodine supplements. No Biotin.  ROS:  + See HPI  Past Medical History:  Diagnosis Date   Anemia    Anxiety    Thyroid disease    Past Surgical History:  Procedure Laterality Date   COLECTOMY N/A 01/26/2022   Procedure: PARTIAL SIGMOID COLECTOMY;  Surgeon: Armandina Gemma, MD;  Location: [facility];  Service:  General;  Laterality: N/A;   NO PAST SURGERIES     Social History   Socioeconomic History   Marital status: Single    Spouse name: Not on file   Number of children: 0   Years of education: Not on file   Highest education level: Not on file  Occupational History   Occupation: Customer service/call center  Tobacco Use   Smoking status: Never   Smokeless tobacco: Never  Vaping Use   Vaping Use: Never used  Substance and Sexual Activity   Alcohol use: Yes    Comment: tequilla 3 drinks every 2 weeks   Drug use: Yes    Types: Marijuana    Comment: occasional   Sexual activity: Yes  Other Topics Concern   Not on file  Social History  Narrative   Not on file   Social Determinants of Health   Financial Resource Strain: Not on file  Food Insecurity: Not on file  Transportation Needs: Not on file  Physical Activity: Not on file  Stress: Not on file  Social Connections: Not on file  Intimate Partner Violence: Not on file   Current Outpatient Medications on File Prior to Visit  Medication Sig Dispense Refill   clobetasol ointment (TEMOVATE) [id number] % Apply 1 Application topically as needed.     hydrocortisone (ANUSOL-HC) 2.5 % rectal cream Place 1 Application rectally 2 (two) times daily. For 7 days (Patient not taking: Reported on 10/16/2022) 30 g 1   hydrOXYzine (ATARAX) 25 MG tablet Take 1 tablet (25 mg total) by mouth every 8 (eight) hours as needed for anxiety. 60 tablet 2   methimazole (TAPAZOLE) 5 MG tablet Take 1 tablet (5 mg total) by mouth every other day. 45 tablet 3   Na Sulfate-K Sulfate-Mg Sulf 17.5-3.13-1.6 GM/177ML SOLN Take 1 kit by mouth as directed. 354 mL 0   ondansetron (ZOFRAN) 4 MG tablet Take 1-2 tablets 30 minutes prior to each bowel prep 4 tablet 0   Current Facility-Administered Medications on File Prior to Visit  Medication Dose Route Frequency Provider Last Rate Last Admin   0.9 %  sodium chloride infusion  500 mL Intravenous Continuous Sharyn Creamer, MD       No Known Allergies Family History  Problem Relation Age of Onset   Hyperthyroidism Mother    Hyperthyroidism Maternal Aunt    Hyperthyroidism Maternal Grandmother    Liver cancer Neg Hx    Esophageal cancer Neg Hx    Colon polyps Neg Hx    PE: LMP 10/01/2022 (Exact Date)  Wt Readings from Last 3 Encounters:  10/16/22 145 lb (65.8 kg)  08/07/22 145 lb (65.8 kg)  05/22/22 146 lb 6.4 oz (66.4 kg)   Constitutional: normal weight, in NAD Eyes: EOMI, no exophthalmos ENT: no thyromegaly, no cervical lymphadenopathy Cardiovascular: tachycardia, RR, No MRG Respiratory: CTA B Musculoskeletal: no deformities Skin: no  rashes Neurological: no tremor with outstretched hands  ASSESSMENT: 1.  Graves' disease  2.  Noncompliance with the recommended plan  PLAN:  1. Patient with history of thyrotoxicosis, diagnosed in 2021, when she presented with weight loss, palpitations, tremors, shortness of breath, and found to have a goiter.  She was initially started on Inderal and methimazole, but had RAI treatment in 06/2020.  She felt better after this but TSH returned high so she was started on levothyroxine 100 mcg daily.  She did not tolerate this well, with return of pretreatment symptoms.  At that time, she stopped levothyroxine.  The  TSH obtained in 10/2020 was suppressed. -I first saw the patient in 01/2021, when she was off levothyroxine.  She had weight loss (11 pounds in 6 months), palpitations (heart rate 123 bpm), anxiety, insomnia.  We discussed that most likely her RAI treatment did not work and I suggested to add back methimazole.  She felt that this may have caused itching and anxiety, but the symptoms did not resolve after coming off the medication.  Also, she was on anxiety medication at that time.  We discussed that thyrotoxicosis itself could cause symptoms like this, but not necessarily the treatment (methimazole).  We also discussed about the possibility of repeating cardiac treatment, but she wanted to give methimazole another try.  I advised her to take methimazole 5 mg daily and at last visit we were able to reduce the dose to 5 mg every other day.  At that time, Graves' antibodies were still elevated, but much improved. -At today's visit, she is feeling well, without thyrotoxic symptoms.  Anxiety persists.  She is on treatment. -At last visit she was mentioning that she was contemplating a pregnancy.  We discussed about treatment of thyrotoxicosis before and during pregnancy.  I advised her about needing to switch from methimazole to PTU in the first trimester, but ideally being off thionamides. -We will  recheck her TFTs today and change the methimazole dose accordingly -I will see her back in 6 months, possibly sooner for labs  2. Noncompliance with the recommended treatment plan -In the past, she would not return for labs after methimazole dose changes and we discussed that this is absolutely necessary -Again, after we change the dose at last visit, she did not return for labs as recommended, in approximately 5 weeks... -She does return at today's visit after 6 months from the previous  Philemon Kingdom, MD PhD [facility] Endocrinology

## 2022-12-19 ENCOUNTER — Telehealth: Payer: Self-pay

## 2022-12-19 NOTE — Telephone Encounter (Signed)
Pt called requesting an appt to go over her thyroid levels.

## 2022-12-19 NOTE — Telephone Encounter (Signed)
She No showed twice. She has another appt in April - let's keep that.

## 2023-01-22 ENCOUNTER — Ambulatory Visit: Payer: Managed Care, Other (non HMO) | Admitting: Internal Medicine

## 2023-04-03 ENCOUNTER — Ambulatory Visit (INDEPENDENT_AMBULATORY_CARE_PROVIDER_SITE_OTHER): Payer: Medicaid Other | Admitting: Internal Medicine

## 2023-04-03 ENCOUNTER — Encounter: Payer: Self-pay | Admitting: Internal Medicine

## 2023-04-03 VITALS — BP 122/70 | HR 90 | Ht 70.0 in | Wt 158.4 lb

## 2023-04-03 DIAGNOSIS — Z91199 Patient's noncompliance with other medical treatment and regimen due to unspecified reason: Secondary | ICD-10-CM | POA: Diagnosis not present

## 2023-04-03 DIAGNOSIS — E0500 Thyrotoxicosis with diffuse goiter without thyrotoxic crisis or storm: Secondary | ICD-10-CM | POA: Diagnosis not present

## 2023-04-03 LAB — T3, FREE: T3, Free: 3.7 pg/mL (ref 2.3–4.2)

## 2023-04-03 LAB — TSH: TSH: 0 u[IU]/mL — ABNORMAL LOW (ref 0.35–5.50)

## 2023-04-03 LAB — T4, FREE: Free T4: 1.27 ng/dL (ref 0.60–1.60)

## 2023-04-03 NOTE — Patient Instructions (Signed)
Please continue: - Methimazole 5 mg every other day.  Please stop at the lab.  Please come back for a follow-up appointment in 6 months.

## 2023-04-03 NOTE — Progress Notes (Signed)
Patient ID: Alexandria Frank, female   DOB: Nov 28, 1990, 32 y.o.   MRN: [id number]  HPI  Alexandria Frank is a 32 y.o.-year-old female, initially referred by her PCP, Alexandria Rigg, NP, returning for follow-up for persistent Graves' disease despite RAI ablation.  She previously saw Dr. Everardo Frank, last visit with him 09/15/2020.  Last visit with me 11 months ago.  Interim history: She reports that recently she developed again itching, heat intolerance/hot flushes, and also weight gain, increased appetite.  She does not have tremors or palpitations.  No recent increased stress.  Reviewed and addended history: Pt. has been dx with Graves' disease in 2021 after she presented with nausea, vomiting, wt loss (25 lbs in 3-4 mo), tremors, palpitations, SOB, and goiter. She had a thyroid uptake and scan (06/02/2020) showing 81.5% uptake and homogeneous scan.   She was initially on methimazole and Inderal. She had RAI treatment on 06/24/2020. She developed post ablative hypothyroidism afterwards, with a TSH of 5.2 in 08/2020, but she could not tolerate levothyroxine 100 mcg due to return of her symptoms, so she stopped the medication.   In 10/2020, TSH was suppressed off the medication. In 01/2021: TSH was undetectable so we started methimazole 5 mg twice a day She did not return for labs as advised... She was off and on MMI before our visit from 09/2021, and completely off 2 weeks prior to this visit. In 09/2021: TSH was low, so I advised her to start methimazole 5 mg daily In 12/2021, TSH was normal.  In 05/2022, MMI was decreased to 5 mg every other day.  She did not return for labs...  In 01/2021, she complained of: - + itching - + heat intolerance - + hair loss (new) - no palpitations now, had these initially - no SOB now, had this initially - + insomnia - + anxiety- on Atarax - + hyperdefecation  I reviewed pt's thyroid tests: Lab Results  Component Value Date   TSH 5.85 (H) 05/22/2022   TSH 2.642  01/24/2022   TSH 0.12 (L) 09/22/2021   TSH <0.01 (L) 03/01/2021   TSH 0.01 (L) 10/06/2020   TSH 5.20 (H) 08/18/2020   TSH <0.01 (L) 04/19/2020   TSH <0.005 (L) 12/15/2019   FREET4 0.61 05/22/2022   FREET4 0.54 (L) 09/22/2021   FREET4 1.70 (H) 03/01/2021   FREET4 1.45 10/06/2020   FREET4 0.20 (L) 08/18/2020   FREET4 3.29 (H) 04/19/2020   FREET4 >7.77 (H) 12/15/2019   T3FREE 5.5 (H) 05/22/2022   T3FREE 2.9 09/22/2021   T3FREE 5.1 (H) 03/01/2021   Lab Results  Component Value Date   T3FREE 5.5 (H) 05/22/2022   T3FREE 2.9 09/22/2021   T3FREE 5.1 (H) 03/01/2021    Antithyroid antibodies: Lab Results  Component Value Date   TSI 223 (H) 05/22/2022   TSI 491 (H) 03/01/2021    No results found for: "THGAB" No components found for: "TPOAB"  Pt denies feeling nodules in neck, hoarseness, dysphagia/odynophagia.  Pt. also has a history of M, M aunt, MGM.No FH of thyroid cancer.  No h/o radiation tx to head or neck other than RAI tx. No recent use of iodine supplements. No Biotin.  She has a history of intussusception in 12/2021.  ROS:  + See HPI  Past Medical History:  Diagnosis Date   Anemia    Anxiety    Thyroid disease    Past Surgical History:  Procedure Laterality Date   COLECTOMY N/A 01/26/2022   Procedure: PARTIAL SIGMOID  COLECTOMY;  Surgeon: Alexandria Level, MD;  Location: [facility];  Service: General;  Laterality: N/A;   NO PAST SURGERIES     Social History   Socioeconomic History   Marital status: Single    Spouse name: Not on file   Number of children: 0   Years of education: Not on file   Highest education Frank: Not on file  Occupational History   Occupation: Customer service/call center  Tobacco Use   Smoking status: Never   Smokeless tobacco: Never  Vaping Use   Vaping Use: Never used  Substance and Sexual Activity   Alcohol use: Yes    Comment: tequilla 3 drinks every 2 weeks   Drug use: Yes    Types: Marijuana    Comment: occasional   Sexual  activity: Yes  Other Topics Concern   Not on file  Social History Narrative   Not on file   Social Determinants of Health   Financial Resource Strain: Not on file  Food Insecurity: Not on file  Transportation Needs: Not on file  Physical Activity: Not on file  Stress: Not on file  Social Connections: Not on file  Intimate Partner Violence: Not on file   Current Outpatient Medications on File Prior to Visit  Medication Sig Dispense Refill   clobetasol ointment (TEMOVATE) 0.05 % Apply 1 Application topically as needed.     hydrocortisone (ANUSOL-HC) 2.5 % rectal cream Place 1 Application rectally 2 (two) times daily. For 7 days (Patient not taking: Reported on 10/16/2022) 30 g 1   hydrOXYzine (ATARAX) 25 MG tablet Take 1 tablet (25 mg total) by mouth every 8 (eight) hours as needed for anxiety. 60 tablet 2   methimazole (TAPAZOLE) 5 MG tablet Take 1 tablet (5 mg total) by mouth every other day. 45 tablet 3   Na Sulfate-K Sulfate-Mg Sulf 17.5-3.13-1.6 GM/177ML SOLN Take 1 kit by mouth as directed. 354 mL 0   ondansetron (ZOFRAN) 4 MG tablet Take 1-2 tablets 30 minutes prior to each bowel prep 4 tablet 0   Current Facility-Administered Medications on File Prior to Visit  Medication Dose Route Frequency Provider Last Rate Last Admin   0.9 %  sodium chloride infusion  500 mL Intravenous Continuous Alexandria Burn, MD       No Known Allergies Family History  Problem Relation Age of Onset   Hyperthyroidism Mother    Hyperthyroidism Maternal Aunt    Hyperthyroidism Maternal Grandmother    Liver cancer Neg Hx    Esophageal cancer Neg Hx    Colon polyps Neg Hx    PE: BP 122/70   Pulse 90   Ht 5\' 10"  (1.778 m)   Wt 158 lb 6.4 oz (71.8 kg)   SpO2 99%   BMI 22.73 kg/m  Wt Readings from Last 3 Encounters:  04/03/23 158 lb 6.4 oz (71.8 kg)  10/16/22 145 lb (65.8 kg)  08/07/22 145 lb (65.8 kg)   Constitutional: normal weight, in NAD Eyes: EOMI, no exophthalmos ENT: no thyromegaly,  no cervical lymphadenopathy Cardiovascular: RRR, No MRG Respiratory: CTA B Musculoskeletal: no deformities Skin: no rashes Neurological: no tremor with outstretched hands  ASSESSMENT: 1.  Graves' disease  2.  Noncompliance with the recommended plan  PLAN:  1. Patient with history of thyrotoxicosis diagnosed in 2021 when she presented with weight loss, palpitations, tremors, shortness of breath, and she was found to have a goiter.  She was initially started on Inderal and methimazole and then had RAI treatment in  06/2020.  She felt better after this but TSH returned high and she was started on levothyroxine 100 mcg daily.  She did not tolerate this well, with return of her previous treatment symptoms.  At that time, she stopped levothyroxine.  A TSH obtained in 10/2020 was suppressed. -I first saw the patient in 01/2021, off levothyroxine.  She had weight loss, palpitations, anxiety, insomnia.  At that time, we discussed that most likely her RAI treatment did not work and we added back methimazole.  She feels that this may have caused itching and anxiety in the past but the symptoms did not resolve even after coming off the medication in the past.  We discussed that thyrotoxicosis itself could have caused the symptoms, and not necessarily the treatment, methimazole.  We discussed about repeating RAI treatment, but she wanted to give methimazole another try.  We started 5 mg daily. -Last visit, she was feeling well, without thyrotoxic symptoms.  TSI's were still elevated and she still had occasional itching but improved.  Anxiety persisted and she was on treatment.  A TSH was slightly high so we backed off the methimazole dose to 5 mg every other day.  Unfortunately, she did not return for labs afterwards. -At last visit, she tells me she is contemplating a pregnancy.  We discussed about the need to have perfect thyroid tests before conception.  I advised her to let me know if she becomes pregnant, in  which case, we can either stop methimazole or switch to PTU, depending on her TFTs.  I did advise that the PTU is the thionamides of choice for the first trimester of pregnancy.  As of now, she does not mention a planned pregnancy. -At today's visit she mentions that she has developed again itching, heat intolerance, and increased appetite and she also gained approximately 13 pounds recently.  She does not have palpitations and tremors.  Anxiety is fairly well-controlled on medications. -Will check TFTs will also add TSI today and change the methimazole dose accordingly -However, she also mentions that she would be amenable to try radioactive iodine treatment again.  We discussed about the fact that after the second RAI treatment, she most likely will develop hypothyroidism and will need to be treated with levothyroxine.  She needs to take this medication every day, fasting.  She agrees with this plan.  I did advise her that before RAI treatment, she will need a thyroid uptake.  Also, before the uptake and the treatment, she would need to be off methimazole for 4 to 5 days.  After the treatment, she will not resume methimazole.  We will then plan to check TFTs in 1 month post tx. -For now, I advised her to come back in 6 months, possibly sooner for labs  2. Noncompliance with the recommended treatment plan -Patient is usually noncompliant with visits, now returning after another longer period of time, without having had labs as recommended in the interim -She was previously also not taking methimazole consistently. -Discussed about possible consequences of uncontrolled thyrotoxicosis to include stroke/heart attack/sudden death -I advised her at last visit and again today that if we make any changes in her methimazole dose, she needs to come back for labs in 4 to 6 weeks. -She agrees with the plan  Needs refills.  Component     Latest Ref Rng 04/03/2023  TSH     0.35 - 5.50 uIU/mL 0.00 Repeated and  verified X2. (L)   T4,Free(Direct)     0.60 -  1.60 ng/dL 1.61   Triiodothyronine,Free,Serum     2.3 - 4.2 pg/mL 3.7   TSI     <140 % baseline 395 (H)   TSI antibodies are higher than before.  TSH is suppressed. For now, I would suggest to continue the same dose of methimazole and arrange for radioactive iodine uptake.  When the results are back, I will order the RAI treatment.  She will then need thyroid labs in 1 month after the treatment.  Carlus Pavlov, MD PhD [facility] Endocrinology

## 2023-04-05 ENCOUNTER — Encounter: Payer: Self-pay | Admitting: Internal Medicine

## 2023-04-06 ENCOUNTER — Other Ambulatory Visit: Payer: Self-pay | Admitting: Internal Medicine

## 2023-04-06 DIAGNOSIS — E0590 Thyrotoxicosis, unspecified without thyrotoxic crisis or storm: Secondary | ICD-10-CM

## 2023-04-06 MED ORDER — METHIMAZOLE 5 MG PO TABS: 5.0000 mg | ORAL_TABLET | Freq: Every day | ORAL | 3 refills | Status: DC

## 2023-04-09 ENCOUNTER — Other Ambulatory Visit: Payer: Self-pay | Admitting: Internal Medicine

## 2023-04-09 DIAGNOSIS — E0590 Thyrotoxicosis, unspecified without thyrotoxic crisis or storm: Secondary | ICD-10-CM

## 2023-04-09 LAB — THYROID STIMULATING IMMUNOGLOBULIN: TSI: 395 % baseline — ABNORMAL HIGH (ref <140)

## 2023-04-17 ENCOUNTER — Telehealth: Payer: Self-pay | Admitting: Internal Medicine

## 2023-04-17 NOTE — Telephone Encounter (Signed)
Please get patient scheduled for labs

## 2023-04-17 NOTE — Telephone Encounter (Signed)
J, We only need labs for her approximately 1 month after her treatment.  Labs are in.  She needs a lab appointment for then.  I can then see her a regular visit on 9/6.

## 2023-04-17 NOTE — Telephone Encounter (Signed)
Patient called to say that Dr. Elvera Lennox wanted to see her a month after her appointment for her procedure which would be 05/21/2023 or after.  The next available appointment was 06/08/2023.  Will this appointment be okay?

## 2023-04-17 NOTE — Telephone Encounter (Signed)
Is the date mentioned below ok?

## 2023-04-18 NOTE — Telephone Encounter (Signed)
LMx1 for patient to call office to schedule lab appointment.

## 2023-04-19 ENCOUNTER — Encounter (HOSPITAL_COMMUNITY): Payer: Medicaid Other

## 2023-04-19 ENCOUNTER — Other Ambulatory Visit: Payer: Self-pay | Admitting: Internal Medicine

## 2023-04-19 ENCOUNTER — Encounter (HOSPITAL_COMMUNITY)
Admission: RE | Admit: 2023-04-19 | Discharge: 2023-04-19 | Disposition: A | Discharge status: Home / Self Care | Payer: Medicaid Other | Source: Ambulatory Visit | Attending: Internal Medicine | Admitting: Internal Medicine

## 2023-04-19 ENCOUNTER — Encounter (HOSPITAL_COMMUNITY): Payer: Self-pay

## 2023-04-19 DIAGNOSIS — E0500 Thyrotoxicosis with diffuse goiter without thyrotoxic crisis or storm: Secondary | ICD-10-CM

## 2023-04-20 ENCOUNTER — Encounter (HOSPITAL_COMMUNITY): Payer: Medicaid Other

## 2023-04-20 ENCOUNTER — Other Ambulatory Visit: Payer: Self-pay | Admitting: Internal Medicine

## 2023-04-20 DIAGNOSIS — E0500 Thyrotoxicosis with diffuse goiter without thyrotoxic crisis or storm: Secondary | ICD-10-CM

## 2023-04-20 NOTE — Telephone Encounter (Signed)
Patient is scheduled for 05/21/2023 for lab appointment.

## 2023-04-23 ENCOUNTER — Other Ambulatory Visit (HOSPITAL_COMMUNITY): Payer: Medicaid Other

## 2023-04-23 ENCOUNTER — Ambulatory Visit (HOSPITAL_COMMUNITY)
Admission: RE | Admit: 2023-04-23 | Discharge: 2023-04-23 | Disposition: A | Discharge status: Home / Self Care | Payer: Medicaid Other | Source: Ambulatory Visit | Attending: Internal Medicine | Admitting: Internal Medicine

## 2023-04-23 DIAGNOSIS — E0500 Thyrotoxicosis with diffuse goiter without thyrotoxic crisis or storm: Secondary | ICD-10-CM | POA: Insufficient documentation

## 2023-04-23 MED ORDER — SODIUM IODIDE I-123 7.4 MBQ CAPS
436.0000 | ORAL_CAPSULE | Freq: Once | ORAL | Status: AC
  Administered 2023-04-23: 436 via ORAL

## 2023-04-23 MED ORDER — SODIUM IODIDE I 131 CAPSULE: 436.0000 | Freq: Once | INTRAVENOUS | Status: DC

## 2023-04-24 ENCOUNTER — Ambulatory Visit (HOSPITAL_COMMUNITY)
Admission: RE | Admit: 2023-04-24 | Discharge: 2023-04-24 | Disposition: A | Discharge status: Home / Self Care | Payer: Medicaid Other | Source: Ambulatory Visit | Attending: Internal Medicine | Admitting: Internal Medicine

## 2023-04-24 DIAGNOSIS — E0500 Thyrotoxicosis with diffuse goiter without thyrotoxic crisis or storm: Secondary | ICD-10-CM | POA: Insufficient documentation

## 2023-04-27 ENCOUNTER — Encounter: Payer: Self-pay | Admitting: Nurse Practitioner

## 2023-04-28 ENCOUNTER — Encounter: Payer: Self-pay | Admitting: Internal Medicine

## 2023-04-30 ENCOUNTER — Other Ambulatory Visit: Payer: Self-pay | Admitting: Nurse Practitioner

## 2023-04-30 DIAGNOSIS — F419 Anxiety disorder, unspecified: Secondary | ICD-10-CM

## 2023-04-30 MED ORDER — HYDROXYZINE HCL 25 MG PO TABS
25.0000 mg | ORAL_TABLET | Freq: Three times a day (TID) | ORAL | 1 refills | Status: DC | PRN
Start: 2023-04-30 — End: 2023-08-17

## 2023-04-30 NOTE — Telephone Encounter (Signed)
I know you are currently out but this patient was responding to you.

## 2023-05-01 ENCOUNTER — Telehealth: Payer: Self-pay

## 2023-05-01 ENCOUNTER — Other Ambulatory Visit: Payer: Self-pay | Admitting: "Endocrinology

## 2023-05-01 DIAGNOSIS — E0500 Thyrotoxicosis with diffuse goiter without thyrotoxic crisis or storm: Secondary | ICD-10-CM

## 2023-05-01 NOTE — Telephone Encounter (Signed)
I received a message from Dr. Elvera Lennox,  This patient needs a RAI Treatment for Hyperthyroidism not thyroid cancer.  Can you please order in her absence,   Dicie Beam

## 2023-05-02 NOTE — Telephone Encounter (Signed)
FYI, she is not Pregnant.

## 2023-05-16 NOTE — Written Directive (Addendum)
nel I-131 WHOLE THYROID THERAPY (NON-CANCER)    RADIOPHARMACEUTICAL:   Iodine-131 Capsule    PRESCRIBED DOSE FOR ADMINISTRATION: 21 mCi    ROUTE OFADMINISTRATION: PO   DIAGNOSIS:  HYPERTHYROIDISM/ GRAVES DISEASE   REFERRING PHYSICIAN:GHERGE/[email or web address]   TSH:    Lab Results  Component Value Date   TSH 0.00 Repeated and verified X2. (L) 04/03/2023   TSH 5.85 (H) 05/22/2022   TSH 2.642 01/24/2022     PRIOR I-131 THERAPY (Date and Dose): 9/23/ 2021 - 12.5 MCI I-131   PRIOR RADIOLOGY EXAMS (Results and Date): RECENT  MULT UPTAKE ONLY ON 04/23/23       24 HR UPTAKE = 4 HR = 58.1 %  24 HR = 57.3 %    ADDITIONAL PHYSICIAN COMMENTS/NOTES: PT HAS STOPPED METHIMAZOLE PRIOR TO UPTAKE&SCAN  Grave disease. Symptomatic.   Refractory -- prior I 131 treatment.  Uptake still high and symptoms.  neck swelling, nervousness, anxiety, weight gain, increased appetite, heat intolerance, difficulty sleeping, moist skin, irritability, increased thirst, increased perspiration, drowsiness, dry skin and dry coarse hair.   AUTHORIZED USER SIGNATURE & TIME STAMP:

## 2023-05-21 ENCOUNTER — Other Ambulatory Visit: Payer: Medicaid Other

## 2023-05-24 ENCOUNTER — Ambulatory Visit (HOSPITAL_COMMUNITY)
Admission: RE | Admit: 2023-05-24 | Discharge: 2023-05-24 | Disposition: A | Discharge status: Home / Self Care | Payer: Medicaid Other | Source: Ambulatory Visit | Attending: "Endocrinology | Admitting: "Endocrinology

## 2023-05-24 DIAGNOSIS — E0500 Thyrotoxicosis with diffuse goiter without thyrotoxic crisis or storm: Secondary | ICD-10-CM | POA: Diagnosis present

## 2023-05-24 LAB — PREGNANCY, URINE: Preg Test, Ur: NEGATIVE

## 2023-05-24 MED ORDER — SODIUM IODIDE I 131 CAPSULE: 20.9000 | Freq: Once | INTRAVENOUS | Status: DC

## 2023-05-28 ENCOUNTER — Ambulatory Visit: Payer: Managed Care, Other (non HMO) | Admitting: Internal Medicine

## 2023-05-29 ENCOUNTER — Encounter: Payer: Self-pay | Admitting: Internal Medicine

## 2023-05-29 NOTE — Telephone Encounter (Signed)
Please Advise,

## 2023-05-29 NOTE — Telephone Encounter (Signed)
I already responded to this a month ago:  April 28, 2023 Me  to Lakeisha Stadler     04/28/23 12:42 AM Hi Shatonya, No, after RAI treatment, you can stay off methimazole. Sincerely, Carlus Pavlov MD

## 2023-06-01 ENCOUNTER — Other Ambulatory Visit: Payer: Self-pay | Admitting: Internal Medicine

## 2023-06-01 DIAGNOSIS — E0590 Thyrotoxicosis, unspecified without thyrotoxic crisis or storm: Secondary | ICD-10-CM

## 2023-06-05 ENCOUNTER — Encounter: Payer: Self-pay | Admitting: Internal Medicine

## 2023-06-08 ENCOUNTER — Ambulatory Visit: Payer: Medicaid Other | Admitting: Internal Medicine

## 2023-06-09 ENCOUNTER — Other Ambulatory Visit: Payer: Self-pay | Admitting: Nurse Practitioner

## 2023-06-10 MED ORDER — CLOBETASOL PROPIONATE 0.05 % EX OINT
1.0000 | TOPICAL_OINTMENT | CUTANEOUS | 1 refills | Status: DC | PRN
  Filled 2023-06-10: qty 30, 30d supply, fill #0

## 2023-06-11 ENCOUNTER — Other Ambulatory Visit (HOSPITAL_COMMUNITY): Payer: Self-pay

## 2023-06-11 ENCOUNTER — Other Ambulatory Visit: Payer: Self-pay

## 2023-06-14 ENCOUNTER — Other Ambulatory Visit: Payer: Self-pay

## 2023-06-15 ENCOUNTER — Ambulatory Visit: Payer: Medicaid Other | Admitting: Internal Medicine

## 2023-06-15 NOTE — Progress Notes (Deleted)
Patient ID: Alexandria Frank, female   DOB: Jul 02, 1991, 32 y.o.   MRN: [id number]  HPI  Alexandria Frank is a 32 y.o.-year-old female, initially referred by her PCP, Claiborne Rigg, NP, returning for follow-up for recurrent Graves' disease despite RAI ablation.  She previously saw Dr. Everardo All, last visit with him 09/15/2020.  Last visit with me 2.5 months ago.  Interim history: Since last visit, patient had a second RAI treatment 05/24/2023.    Reviewed and addended history: Pt. has been dx with Graves' disease in 2021 after she presented with nausea, vomiting, wt loss (25 lbs in 3-4 mo), tremors, palpitations, SOB, and goiter. She had a thyroid uptake and scan (06/02/2020) showing 81.5% uptake and homogeneous scan.   She was initially on methimazole and Inderal. She had RAI treatment on 06/24/2020. She developed post ablative hypothyroidism afterwards, with a TSH of 5.2 in 08/2020, but she could not tolerate levothyroxine 100 mcg due to return of her symptoms, so she stopped the medication.   In 10/2020, TSH was suppressed off the medication. In 01/2021: TSH was undetectable so we started methimazole 5 mg twice a day. At that time, she complained of: - + itching - + heat intolerance - + hair loss (new) - no palpitations now, had these initially - no SOB now, had this initially - + insomnia - + anxiety- on Atarax - + hyperdefecation She did not return for labs as advised... She was off and on MMI before our visit from 09/2021, and completely off 2 weeks prior to this visit. In 09/2021: TSH was low, so I advised her to start methimazole 5 mg daily In 12/2021, TSH was normal.  In 05/2022, MMI was decreased to 5 mg every other day.  She did not return for labs...   I reviewed pt's thyroid tests: Lab Results  Component Value Date   TSH 0.00 Repeated and verified X2. (L) 04/03/2023   TSH 5.85 (H) 05/22/2022   TSH 2.642 01/24/2022   TSH 0.12 (L) 09/22/2021   TSH <0.01 (L) 03/01/2021   TSH 0.01  (L) 10/06/2020   TSH 5.20 (H) 08/18/2020   TSH <0.01 (L) 04/19/2020   TSH <0.005 (L) 12/15/2019   FREET4 1.27 04/03/2023   FREET4 0.61 05/22/2022   FREET4 0.54 (L) 09/22/2021   FREET4 1.70 (H) 03/01/2021   FREET4 1.45 10/06/2020   FREET4 0.20 (L) 08/18/2020   FREET4 3.29 (H) 04/19/2020   FREET4 >7.77 (H) 12/15/2019   T3FREE 3.7 04/03/2023   T3FREE 5.5 (H) 05/22/2022   T3FREE 2.9 09/22/2021   T3FREE 5.1 (H) 03/01/2021   Lab Results  Component Value Date   T3FREE 3.7 04/03/2023   T3FREE 5.5 (H) 05/22/2022   T3FREE 2.9 09/22/2021   T3FREE 5.1 (H) 03/01/2021    Antithyroid antibodies: Lab Results  Component Value Date   TSI 395 (H) 04/03/2023   TSI 223 (H) 05/22/2022   TSI 491 (H) 03/01/2021    No results found for: "THGAB" No components found for: "TPOAB"  Pt denies feeling nodules in neck, hoarseness, dysphagia/odynophagia.  Pt. also has a history of M, M aunt, MGM.No FH of thyroid cancer.  No h/o radiation tx to head or neck other than RAI tx. No recent use of iodine supplements. No Biotin.  She has a history of intussusception in 12/2021.  ROS:  + See HPI  Past Medical History:  Diagnosis Date   Anemia    Anxiety    Thyroid disease    Past Surgical History:  Procedure Laterality Date   COLECTOMY N/A 01/26/2022   Procedure: PARTIAL SIGMOID COLECTOMY;  Surgeon: Darnell Level, MD;  Location: [facility];  Service: General;  Laterality: N/A;   NO PAST SURGERIES     Social History   Socioeconomic History   Marital status: Single    Spouse name: Not on file   Number of children: 0   Years of education: Not on file   Highest education level: Not on file  Occupational History   Occupation: Customer service/call center  Tobacco Use   Smoking status: Never   Smokeless tobacco: Never  Vaping Use   Vaping status: Never Used  Substance and Sexual Activity   Alcohol use: Yes    Comment: tequilla 3 drinks every 2 weeks   Drug use: Yes    Types: Marijuana     Comment: occasional   Sexual activity: Yes  Other Topics Concern   Not on file  Social History Narrative   Not on file   Social Determinants of Health   Financial Resource Strain: Not on file  Food Insecurity: Not on file  Transportation Needs: Not on file  Physical Activity: Not on file  Stress: Not on file  Social Connections: Not on file  Intimate Partner Violence: Not on file   Current Outpatient Medications on File Prior to Visit  Medication Sig Dispense Refill   clobetasol ointment (TEMOVATE) 0.05 % Apply topically as needed. 30 g 1   hydrOXYzine (ATARAX) 25 MG tablet Take 1 tablet (25 mg total) by mouth every 8 (eight) hours as needed for anxiety. 60 tablet 1   methimazole (TAPAZOLE) 5 MG tablet TAKE 1 TABLET BY MOUTH EVERY OTHER DAY 45 tablet 1   Current Facility-Administered Medications on File Prior to Visit  Medication Dose Route Frequency Provider Last Rate Last Admin   0.9 %  sodium chloride infusion  500 mL Intravenous Continuous Imogene Burn, MD       No Known Allergies Family History  Problem Relation Age of Onset   Hyperthyroidism Mother    Hyperthyroidism Maternal Aunt    Hyperthyroidism Maternal Grandmother    Liver cancer Neg Hx    Esophageal cancer Neg Hx    Colon polyps Neg Hx    PE: There were no vitals taken for this visit. Wt Readings from Last 3 Encounters:  04/03/23 158 lb 6.4 oz (71.8 kg)  10/16/22 145 lb (65.8 kg)  08/07/22 145 lb (65.8 kg)   Constitutional: normal weight, in NAD Eyes: EOMI, no exophthalmos ENT: no thyromegaly, no cervical lymphadenopathy Cardiovascular: RRR, No MRG Respiratory: CTA B Musculoskeletal: no deformities Skin: no rashes Neurological: no tremor with outstretched hands  ASSESSMENT: 1.  Recurrent Graves' disease  2.  Status post RAI treatment  3.  History of noncompliance with the recommended treatment plan  PLAN:  1. Patient with history of thyrotoxicosis diagnosed in 2021 when she presented with  weight loss, palpitations, tremors, shortness of breath and she was found to have a goiter.  She was initially started on Inderal and methimazole and then had RAI treatment in 06/2020.  She felt better after the RAI treatment but TSH returned high and she was started on levothyroxine 100 mcg daily.  She did not tolerate this well, with return of the previous treatment symptoms.  At that time, she stopped levothyroxine.  A TSH obtained in 10/2020 was suppressed.  I first saw the patient in 01/2021, off levothyroxine.  At that time, she had weight loss, palpitations, anxiety,  insomnia.  We discussed that her RAI treatment most likely did not work and we added back methimazole.  She felt that this may have caused itching and anxiety in the past but symptoms did not resolve completely after coming off medication.  I advised her that thyrotoxicosis itself could have caused the symptoms and not necessarily the thionamides.  We started back on methimazole, which she tolerated well.  However, her TFTs remained thyrotoxic and TSI's are still elevated, with still occasional itching.  She was contemplating a pregnancy and we discussed about definitive treatment of her thyrotoxicosis/Graves' disease before this.   -At last visit, she again had itching, heat intolerance, and increased appetite, along with a weight gain of 13 pounds.  No palpitations or tremors.  Anxiety was well-controlled.  A TSH was very low, at 0, and TSI's were higher.  At that time, we discussed about performing another RAI treatment.  She agreed with the plan.  We performed a thyroid uptake and scan first (04/24/2023) and then RAI treatment (05/24/2023).  I advised her not to resume methimazole after RAI treatment. -She mentions that she is feeling well but she has been out of work and will need a letter excusing her from work, due to the intensive medical management of her condition in the last 2 months  2.  History of noncompliance with the recommended  treatment plan -She has a history of being noncompliant with visits or having had labs as recommended in the interim.  Also, she was not taking methimazole consistently in the past. -At today's visit we discussed about the almost 100% likelihood of developing hypothyroidism after her second RAI treatment and the absolute need of compliance with levothyroxine afterwards. -I did advise her about how to take levothyroxine correctly, every day, with water, at least 30 minutes before breakfast, separated by at least 4 hours from: - acid reflux medications - calcium - iron - multivitamins -We also discussed that after every change in dose, will need to have labs in about 5 to 6 weeks -We will recheck her tests today and advised her about levothyroxine afterwards.  Needs refills.   Carlus Pavlov, MD PhD [facility]

## 2023-06-27 ENCOUNTER — Encounter: Payer: Self-pay | Admitting: Internal Medicine

## 2023-06-27 ENCOUNTER — Ambulatory Visit (INDEPENDENT_AMBULATORY_CARE_PROVIDER_SITE_OTHER): Payer: Medicaid Other | Admitting: Internal Medicine

## 2023-06-27 VITALS — BP 136/80 | HR 72 | Resp 16 | Ht 70.0 in | Wt 169.0 lb

## 2023-06-27 DIAGNOSIS — E890 Postprocedural hypothyroidism: Secondary | ICD-10-CM | POA: Diagnosis not present

## 2023-06-27 DIAGNOSIS — E0500 Thyrotoxicosis with diffuse goiter without thyrotoxic crisis or storm: Secondary | ICD-10-CM

## 2023-06-27 DIAGNOSIS — Z91199 Patient's noncompliance with other medical treatment and regimen due to unspecified reason: Secondary | ICD-10-CM

## 2023-06-27 LAB — T4, FREE: Free T4: 0.22 ng/dL — ABNORMAL LOW (ref 0.60–1.60)

## 2023-06-27 LAB — TSH: TSH: 34.77 u[IU]/mL — ABNORMAL HIGH (ref 0.35–5.50)

## 2023-06-27 LAB — T3, FREE: T3, Free: 2 pg/mL — ABNORMAL LOW (ref 2.3–4.2)

## 2023-06-27 NOTE — Patient Instructions (Addendum)
Please stop at the lab.  If we start Levothyroxine, please take this: every day, with water, at least 30 minutes before breakfast, separated by at least 4 hours from: - acid reflux medications - calcium - iron - multivitamins  Please return in 3-4 months.

## 2023-06-27 NOTE — Progress Notes (Unsigned)
Patient ID: Camorah Mcadoo, female   DOB: 07-29-1991, 32 y.o.   MRN: [id number]  HPI  Kameren Repinski is a 32 y.o.-year-old female, initially referred by her PCP, Claiborne Rigg, NP, returning for follow-up for recurrent Graves' disease despite RAI ablation.  She previously saw Dr. Everardo All, last visit with him 09/15/2020.  Last visit with me almost 3 months ago.  Interim history: Since last visit, patient had a second RAI treatment 05/24/2023.   She has low iron >> started [facility] for the iron. She feel better, more energetic now. She gained weight since last OV, about which she is quite happy. No cold intolerance, constipation, or other complaints.  Reviewed and addended history: Pt. has been dx with Graves' disease in 2021 after she presented with nausea, vomiting, wt loss (25 lbs in 3-4 mo), tremors, palpitations, SOB, and goiter. She had a thyroid uptake and scan (06/02/2020) showing 81.5% uptake and homogeneous scan.   She was initially on methimazole and Inderal. She had RAI treatment on 06/24/2020. She developed post ablative hypothyroidism afterwards, with a TSH of 5.2 in 08/2020, but she could not tolerate levothyroxine 100 mcg due to return of her symptoms, so she stopped the medication.   In 10/2020, TSH was suppressed off the medication. In 01/2021: TSH was undetectable so we started methimazole 5 mg twice a day. At that time, she complained of: - + itching - + heat intolerance - + hair loss (new) - no palpitations now, had these initially - no SOB now, had this initially - + insomnia - + anxiety- on Atarax - + hyperdefecation She did not return for labs as advised... She was off and on MMI before our visit from 09/2021, and completely off 2 weeks prior to this visit. In 09/2021: TSH was low, so I advised her to start methimazole 5 mg daily In 12/2021, TSH was normal.  In 05/2022, MMI was decreased to 5 mg every other day.  She did not return for labs...  On 04/24/2023, she had a  thyroid uptake consistent with Graves' disease:  The 4 hour radioactive uptake of iodine by the thyroid gland is 58.1% (normal range 5-15%). The 24 hour uptake is 57.3% (normal range 10-30%).  On 05/24/2023, she had a second RAI treatment  I reviewed pt's thyroid tests: Lab Results  Component Value Date   TSH 0.00 Repeated and verified X2. (L) 04/03/2023   TSH 5.85 (H) 05/22/2022   TSH 2.642 01/24/2022   TSH 0.12 (L) 09/22/2021   TSH <0.01 (L) 03/01/2021   TSH 0.01 (L) 10/06/2020   TSH 5.20 (H) 08/18/2020   TSH <0.01 (L) 04/19/2020   TSH <0.005 (L) 12/15/2019   FREET4 1.27 04/03/2023   FREET4 0.61 05/22/2022   FREET4 0.54 (L) 09/22/2021   FREET4 1.70 (H) 03/01/2021   FREET4 1.45 10/06/2020   FREET4 0.20 (L) 08/18/2020   FREET4 3.29 (H) 04/19/2020   FREET4 >7.77 (H) 12/15/2019   T3FREE 3.7 04/03/2023   T3FREE 5.5 (H) 05/22/2022   T3FREE 2.9 09/22/2021   T3FREE 5.1 (H) 03/01/2021   Lab Results  Component Value Date   T3FREE 3.7 04/03/2023   T3FREE 5.5 (H) 05/22/2022   T3FREE 2.9 09/22/2021   T3FREE 5.1 (H) 03/01/2021    Antithyroid antibodies: Lab Results  Component Value Date   TSI 395 (H) 04/03/2023   TSI 223 (H) 05/22/2022   TSI 491 (H) 03/01/2021    No results found for: "THGAB" No components found for: "TPOAB"  Pt denies  feeling nodules in neck, hoarseness, dysphagia/odynophagia.  Pt. also has a history of M, M aunt, MGM.No FH of thyroid cancer.  No h/o radiation tx to head or neck other than RAI tx. No recent use of iodine supplements. No Biotin.  She has a history of intussusception in 12/2021.  ROS:  + See HPI  Past Medical History:  Diagnosis Date   Anemia    Anxiety    Thyroid disease    Past Surgical History:  Procedure Laterality Date   COLECTOMY N/A 01/26/2022   Procedure: PARTIAL SIGMOID COLECTOMY;  Surgeon: Darnell Level, MD;  Location: [facility];  Service: General;  Laterality: N/A;   NO PAST SURGERIES     Social History   Socioeconomic  History   Marital status: Single    Spouse name: Not on file   Number of children: 0   Years of education: Not on file   Highest education level: Not on file  Occupational History   Occupation: Customer service/call center  Tobacco Use   Smoking status: Never   Smokeless tobacco: Never  Vaping Use   Vaping status: Never Used  Substance and Sexual Activity   Alcohol use: Yes    Comment: tequilla 3 drinks every 2 weeks   Drug use: Yes    Types: Marijuana    Comment: occasional   Sexual activity: Yes  Other Topics Concern   Not on file  Social History Narrative   Not on file   Social Determinants of Health   Financial Resource Strain: Not on file  Food Insecurity: Not on file  Transportation Needs: Not on file  Physical Activity: Not on file  Stress: Not on file  Social Connections: Not on file  Intimate Partner Violence: Not on file   Current Outpatient Medications on File Prior to Visit  Medication Sig Dispense Refill   clobetasol ointment (TEMOVATE) 0.05 % Apply topically as needed. 30 g 1   hydrOXYzine (ATARAX) 25 MG tablet Take 1 tablet (25 mg total) by mouth every 8 (eight) hours as needed for anxiety. 60 tablet 1   methimazole (TAPAZOLE) 5 MG tablet TAKE 1 TABLET BY MOUTH EVERY OTHER DAY 45 tablet 1   Current Facility-Administered Medications on File Prior to Visit  Medication Dose Route Frequency Provider Last Rate Last Admin   0.9 %  sodium chloride infusion  500 mL Intravenous Continuous Imogene Burn, MD       No Known Allergies Family History  Problem Relation Age of Onset   Hyperthyroidism Mother    Hyperthyroidism Maternal Aunt    Hyperthyroidism Maternal Grandmother    Liver cancer Neg Hx    Esophageal cancer Neg Hx    Colon polyps Neg Hx    PE: BP 136/80 (BP Location: Left Arm, Patient Position: Sitting, Cuff Size: Normal)   Pulse 72   Resp 16   Ht 5\' 10"  (1.778 m)   Wt 169 lb (76.7 kg)   SpO2 99%   BMI 24.25 kg/m  Wt Readings from Last 3  Encounters:  06/27/23 169 lb (76.7 kg)  04/03/23 158 lb 6.4 oz (71.8 kg)  10/16/22 145 lb (65.8 kg)   Constitutional: normal weight, in NAD Eyes: EOMI, no exophthalmos ENT: no thyromegaly, no cervical lymphadenopathy Cardiovascular: RRR, No MRG Respiratory: CTA B Musculoskeletal: no deformities Skin: no rashes Neurological: no tremor with outstretched hands  ASSESSMENT: 1.  Recurrent Graves' disease  2.  Status post RAI treatment  3.  History of noncompliance with the recommended  treatment plan  PLAN:  1. And 2. Patient with history of thyrotoxicosis diagnosed in 2021 when she presented with weight loss, palpitations, tremors, shortness of breath, and was found to have a goiter.  She was initially started on Inderal and methimazole and then had RAI treatment in 06/2020.  She felt better after RAI treatment but TSH returned elevated and she was started on levothyroxine 100 mcg daily.  She did not tolerate this well, with return of the previous symptoms.  At that time, she stopped levothyroxine.  A TSH obtained in 10/2020 was suppressed.  I first saw the patient in 01/2021, while off levothyroxine.  She had weight loss, palpitations, anxiety, insomnia.  We discussed that her RAI treatment most likely did not work and we added back methimazole.  She felt that this may have caused itching and anxiety in the past but symptoms did not resolve completely even after coming off the medication.  I advised her that thyrotoxicosis itself could have caused the symptoms and not necessarily the thionamide.  We started back on methimazole, which she tolerated well.  However, her TFTs remained thyrotoxic and her TSI's were still elevated and she still had occasional itching.  She was contemplating a pregnancy and we discussed about definitive treatment of her thyrotoxicosis/Graves' disease before a possible pregnancy. -At last visit she again had itching, heat intolerance, and increased appetite along with a  weight gain of 13 pounds.  No palpitations or tremors.  Anxiety was well-controlled.  A TSH is very low, at 0 and TSI's were higher.  At that time, we discussed about performing another RAI treatment and she agreed.  We performed a thyroid uptake first (04/24/2023), and then RAI treatment (05/24/2023).  I advised her not to resume methimazole after RAI treatment. -At today's visit, she is feeling well, without complaints.  No fatigue, constipation, cold intolerance.  She did gain weight, 11 pounds in the last 2 months, but she is happy about this - she has been out of work and needed a letter excusing her from work due to intensive medical management of her condition in the last 2 months - letter is in her chart -At today's visit, we will check TFTs and see if she developed hypothyroidism, in which case we will need to start levothyroxine (see below) -I plan to see her back in 3-4 months, but will need labs before then  2.  History of noncompliance with the recommended treatment plan -She has a history of being noncompliant with visits and having labs as recommended between visits.  Also, she was not taking methimazole consistently in the past -She missed our last appointment approximately 2 weeks ago -At today's visit we discussed about the very high likelihood of developing hypothyroidism after her second RAI treatment and the absolute need of compliance with levothyroxine afterwards -I did advise her to take levothyroxine correctly, every day, with water, at least 30 minutes before breakfast and separated by at least 4 hours from: - acid reflux medications - calcium - iron - multivitamins -We also discussed that after every change in dose, will need to have labs in about 5 to 6 weeks -Will recheck her tests today and see if we need to start levothyroxine.  Carlus Pavlov, MD PhD [facility]

## 2023-06-28 ENCOUNTER — Encounter: Payer: Self-pay | Admitting: Internal Medicine

## 2023-06-28 MED ORDER — LEVOTHYROXINE SODIUM 75 MCG PO TABS: 75.0000 ug | ORAL_TABLET | Freq: Every day | ORAL | 3 refills | Status: DC

## 2023-07-09 ENCOUNTER — Encounter: Payer: Self-pay | Admitting: Nurse Practitioner

## 2023-07-10 ENCOUNTER — Ambulatory Visit: Payer: Medicaid Other | Attending: Nurse Practitioner | Admitting: Nurse Practitioner

## 2023-07-10 ENCOUNTER — Other Ambulatory Visit (HOSPITAL_COMMUNITY)
Admission: RE | Admit: 2023-07-10 | Discharge: 2023-07-10 | Disposition: A | Discharge status: Home / Self Care | Payer: Medicaid Other | Source: Ambulatory Visit | Attending: Nurse Practitioner | Admitting: Nurse Practitioner

## 2023-07-10 VITALS — BP 112/80 | HR 70 | Ht 70.0 in | Wt 168.8 lb

## 2023-07-10 DIAGNOSIS — E785 Hyperlipidemia, unspecified: Secondary | ICD-10-CM | POA: Diagnosis not present

## 2023-07-10 DIAGNOSIS — E876 Hypokalemia: Secondary | ICD-10-CM

## 2023-07-10 DIAGNOSIS — Z0000 Encounter for general adult medical examination without abnormal findings: Secondary | ICD-10-CM | POA: Diagnosis not present

## 2023-07-10 DIAGNOSIS — Z7251 High risk heterosexual behavior: Secondary | ICD-10-CM | POA: Diagnosis present

## 2023-07-10 DIAGNOSIS — F419 Anxiety disorder, unspecified: Secondary | ICD-10-CM | POA: Diagnosis not present

## 2023-07-10 DIAGNOSIS — D509 Iron deficiency anemia, unspecified: Secondary | ICD-10-CM

## 2023-07-10 NOTE — Progress Notes (Signed)
Assessment & Plan:  Alexandria Frank was seen today for annual exam.  Diagnoses and all orders for this visit:  Encounter for annual physical exam  Anxiety -     Ambulatory referral to Behavioral Health  Iron deficiency anemia, unspecified iron deficiency anemia type -     CBC with Differential -     Iron, TIBC and Ferritin Panel  Dyslipidemia, goal LDL below 100 -     Lipid panel  Low blood potassium -     CMP14+EGFR  High risk heterosexual behavior -     Cervicovaginal ancillary only    Patient has been counseled on age-appropriate routine health concerns for screening and prevention. These are reviewed and up-to-date. Referrals have been placed accordingly. Immunizations are up-to-date or declined.    Subjective:   Chief Complaint  Patient presents with   Annual Exam   HPI Alexandria Frank 32 y.o. female presents to office today for annual physical exam.   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 Recent RAI and now will be taking lifelong levothyroxine.   She has a past medical history of Anemia, Anxiety, Depression (10/03/2021), Thyroid disease, and Tubulovillous adenoma of colon.   Would like to know if she qualifies for disability due to her anxiety and depression. She is currently taking hydroxyzine but no SSRI. Would like referral to behavioral health. TSH is still abnormal which could contribute to depression   She is requesting vaginal swab today. Currently denies any symptoms of vaginitis.   Review of Systems  Constitutional:  Negative for fever, malaise/fatigue and weight loss.  HENT: Negative.  Negative for nosebleeds.   Eyes: Negative.  Negative for blurred vision, double vision and photophobia.  Respiratory: Negative.  Negative for cough and shortness of breath.   Cardiovascular: Negative.  Negative for chest pain, palpitations and leg swelling.  Gastrointestinal: Negative.  Negative for heartburn, nausea and vomiting.  Genitourinary: Negative.    Musculoskeletal: Negative.  Negative for myalgias.  Skin: Negative.   Neurological: Negative.  Negative for dizziness, focal weakness, seizures and headaches.  Endo/Heme/Allergies: Negative.   Psychiatric/Behavioral:  Positive for depression. Negative for suicidal ideas. The patient is nervous/anxious.     Past Medical History:  Diagnosis Date   Anemia    Anxiety    Depression 10/03/2021   Thyroid disease    Tubulovillous adenoma of colon     Past Surgical History:  Procedure Laterality Date   COLECTOMY N/A 01/26/2022   Procedure: PARTIAL SIGMOID COLECTOMY;  Surgeon: Darnell Level, MD;  Location: [facility];  Service: General;  Laterality: N/A;   NO PAST SURGERIES     SMALL INTESTINE SURGERY      Family History  Problem Relation Age of Onset   Hyperthyroidism Mother    Hyperthyroidism Maternal Aunt    Hyperthyroidism Maternal Grandmother    Liver cancer Neg Hx    Esophageal cancer Neg Hx    Colon polyps Neg Hx     Social History Reviewed with no changes to be made today.   Outpatient Medications Prior to Visit  Medication Sig Dispense Refill   hydrOXYzine (ATARAX) 25 MG tablet Take 1 tablet (25 mg total) by mouth every 8 (eight) hours as needed for anxiety. 60 tablet 1   clobetasol ointment (TEMOVATE) 0.05 % Apply topically as needed. (Patient not taking: Reported on 06/27/2023) 30 g 1   levothyroxine (SYNTHROID) 75 MCG tablet Take 1 tablet (75 mcg total) by mouth daily. (Patient not taking: Reported on 07/10/2023) 90  tablet 3   methimazole (TAPAZOLE) 5 MG tablet TAKE 1 TABLET BY MOUTH EVERY OTHER DAY (Patient not taking: Reported on 06/27/2023) 45 tablet 1   0.9 %  sodium chloride infusion      No facility-administered medications prior to visit.    No Known Allergies     Objective:    BP 112/80 (BP Location: Left Arm, Patient Position: Sitting, Cuff Size: Normal)   Pulse 70   Ht 5\' 10"  (1.778 m)   Wt 168 lb 12.8 oz (76.6 kg)   LMP 07/01/2023 (Exact Date)   SpO2  100%   BMI 24.22 kg/m  Wt Readings from Last 3 Encounters:  07/10/23 168 lb 12.8 oz (76.6 kg)  06/27/23 169 lb (76.7 kg)  04/03/23 158 lb 6.4 oz (71.8 kg)    Physical Exam Constitutional:      Appearance: She is well-developed.  HENT:     Head: Normocephalic and atraumatic.     Right Ear: Hearing, tympanic membrane, ear canal and external ear normal.     Left Ear: Hearing, tympanic membrane, ear canal and external ear normal.     Nose: Nose normal.     Right Turbinates: Not enlarged.     Left Turbinates: Not enlarged.     Mouth/Throat:     Lips: Pink.     Mouth: Mucous membranes are moist.     Dentition: No dental tenderness, gingival swelling, dental abscesses or gum lesions.     Pharynx: No oropharyngeal exudate.  Eyes:     General: No scleral icterus.       Right eye: No discharge.     Extraocular Movements: Extraocular movements intact.     Conjunctiva/sclera: Conjunctivae normal.     Pupils: Pupils are equal, round, and reactive to light.  Neck:     Thyroid: No thyromegaly.     Trachea: No tracheal deviation.  Cardiovascular:     Rate and Rhythm: Normal rate and regular rhythm.     Heart sounds: Normal heart sounds. No murmur heard.    No friction rub.  Pulmonary:     Effort: Pulmonary effort is normal. No accessory muscle usage or respiratory distress.     Breath sounds: Normal breath sounds. No decreased breath sounds, wheezing, rhonchi or rales.  Abdominal:     General: Bowel sounds are normal. There is no distension.     Palpations: Abdomen is soft. There is no mass.     Tenderness: There is no abdominal tenderness. There is no right CVA tenderness, left CVA tenderness, guarding or rebound.     Hernia: No hernia is present.  Genitourinary:    Comments: SELF SWAB  Musculoskeletal:        General: No tenderness or deformity. Normal range of motion.     Cervical back: Normal range of motion and neck supple.  Lymphadenopathy:     Cervical: No cervical  adenopathy.  Skin:    General: Skin is warm and dry.     Findings: No erythema.  Neurological:     Mental Status: She is alert and oriented to person, place, and time.     Cranial Nerves: No cranial nerve deficit.     Motor: Motor function is intact.     Coordination: Coordination is intact. Coordination normal.     Gait: Gait is intact.     Deep Tendon Reflexes:     Reflex Scores:      Patellar reflexes are 1+ on the right side and 1+ on  the left side. Psychiatric:        Attention and Perception: Attention normal.        Mood and Affect: Mood normal.        Speech: Speech normal.        Behavior: Behavior normal.        Thought Content: Thought content normal.        Judgment: Judgment normal.          Patient has been counseled extensively about nutrition and exercise as well as the importance of adherence with medications and regular follow-up. The patient was given clear instructions to go to ER or return to medical center if symptoms don't improve, worsen or new problems develop. The patient verbalized understanding.   Follow-up: Return if symptoms worsen or fail to improve.   Claiborne Rigg, FNP-BC [facility] [place], [place] [phone]   07/10/2023, 1:42 PM

## 2023-07-11 LAB — CERVICOVAGINAL ANCILLARY ONLY
Bacterial Vaginitis (gardnerella): NEGATIVE
Candida Glabrata: NEGATIVE
Candida Vaginitis: NEGATIVE
Chlamydia: NEGATIVE
Comment: NEGATIVE
Comment: NEGATIVE
Comment: NEGATIVE
Comment: NEGATIVE
Comment: NEGATIVE
Comment: NORMAL
Neisseria Gonorrhea: NEGATIVE
Trichomonas: NEGATIVE

## 2023-07-11 LAB — LIPID PANEL
Chol/HDL Ratio: 3.1 {ratio} (ref 0.0–4.4)
Cholesterol, Total: 299 mg/dL — ABNORMAL HIGH (ref 100–199)
HDL: 98 mg/dL (ref >39)
LDL Chol Calc (NIH): 182 mg/dL — ABNORMAL HIGH (ref 0–99)
Triglycerides: 113 mg/dL (ref 0–149)
VLDL Cholesterol Cal: 19 mg/dL (ref 5–40)

## 2023-07-11 LAB — CMP14+EGFR
ALT: 12 [IU]/L (ref 0–32)
AST: 23 [IU]/L (ref 0–40)
Albumin: 4.7 g/dL (ref 4.0–5.0)
Alkaline Phosphatase: 77 [IU]/L (ref 44–121)
BUN/Creatinine Ratio: 9 (ref 9–23)
BUN: 9 mg/dL (ref 6–20)
Bilirubin Total: 0.3 mg/dL (ref 0.0–1.2)
CO2: 22 mmol/L (ref 20–29)
Calcium: 9.5 mg/dL (ref 8.7–10.2)
Chloride: 101 mmol/L (ref 96–106)
Creatinine, Ser: 1 mg/dL (ref 0.57–1.00)
Globulin, Total: 2.9 g/dL (ref 1.5–4.5)
Glucose: 80 mg/dL (ref 70–99)
Potassium: 4 mmol/L (ref 3.5–5.2)
Sodium: 139 mmol/L (ref 134–144)
Total Protein: 7.6 g/dL (ref 6.0–8.5)
eGFR: 78 mL/min/{1.73_m2} (ref >59)

## 2023-07-11 LAB — CBC WITH DIFFERENTIAL/PLATELET
Basophils Absolute: 0.1 10*3/uL (ref 0.0–0.2)
Basos: 1 %
EOS (ABSOLUTE): 0.2 10*3/uL (ref 0.0–0.4)
Eos: 2 %
Hematocrit: 41.3 % (ref 34.0–46.6)
Hemoglobin: 13 g/dL (ref 11.1–15.9)
Immature Grans (Abs): 0 10*3/uL (ref 0.0–0.1)
Immature Granulocytes: 0 %
Lymphocytes Absolute: 2.4 10*3/uL (ref 0.7–3.1)
Lymphs: 32 %
MCH: 27.2 pg (ref 26.6–33.0)
MCHC: 31.5 g/dL (ref 31.5–35.7)
MCV: 86 fL (ref 79–97)
Monocytes Absolute: 0.4 10*3/uL (ref 0.1–0.9)
Monocytes: 5 %
Neutrophils Absolute: 4.5 10*3/uL (ref 1.4–7.0)
Neutrophils: 60 %
Platelets: 326 10*3/uL (ref 150–450)
RBC: 4.78 x10E6/uL (ref 3.77–5.28)
RDW: 18.1 % — ABNORMAL HIGH (ref 11.7–15.4)
WBC: 7.5 10*3/uL (ref 3.4–10.8)

## 2023-07-11 LAB — IRON,TIBC AND FERRITIN PANEL
Ferritin: 13 ng/mL — ABNORMAL LOW (ref 15–150)
Iron Saturation: 6 % — CL (ref 15–55)
Iron: 24 ug/dL — ABNORMAL LOW (ref 27–159)
Total Iron Binding Capacity: 387 ug/dL (ref 250–450)
UIBC: 363 ug/dL (ref 131–425)

## 2023-08-17 ENCOUNTER — Other Ambulatory Visit: Payer: Self-pay | Admitting: Nurse Practitioner

## 2023-08-17 DIAGNOSIS — F419 Anxiety disorder, unspecified: Secondary | ICD-10-CM

## 2023-09-10 ENCOUNTER — Ambulatory Visit (HOSPITAL_COMMUNITY): Payer: Medicaid Other | Admitting: Clinical

## 2023-09-30 ENCOUNTER — Encounter (HOSPITAL_COMMUNITY): Payer: Self-pay

## 2023-09-30 ENCOUNTER — Other Ambulatory Visit: Payer: Self-pay

## 2023-09-30 ENCOUNTER — Emergency Department (HOSPITAL_COMMUNITY)
Admission: EM | Admit: 2023-09-30 | Discharge: 2023-09-30 | Discharge status: Against Medical Advice | Payer: Medicaid Other | Attending: Emergency Medicine | Admitting: Emergency Medicine

## 2023-09-30 DIAGNOSIS — Z5321 Procedure and treatment not carried out due to patient leaving prior to being seen by health care provider: Secondary | ICD-10-CM | POA: Diagnosis not present

## 2023-09-30 DIAGNOSIS — R220 Localized swelling, mass and lump, head: Secondary | ICD-10-CM | POA: Diagnosis present

## 2023-09-30 NOTE — ED Triage Notes (Signed)
Patient reports temple swelling and intermittent jaw pain x "a couple days". Denies headaches and no hx of TMJ

## 2023-10-05 ENCOUNTER — Ambulatory Visit: Payer: Medicaid Other | Admitting: Internal Medicine

## 2023-10-10 ENCOUNTER — Encounter (HOSPITAL_COMMUNITY): Payer: Self-pay | Admitting: Emergency Medicine

## 2023-10-10 ENCOUNTER — Emergency Department (HOSPITAL_COMMUNITY): Payer: Medicaid Other

## 2023-10-10 ENCOUNTER — Emergency Department (HOSPITAL_COMMUNITY)
Admission: EM | Admit: 2023-10-10 | Discharge: 2023-10-10 | Discharge status: Against Medical Advice | Payer: Medicaid Other | Attending: Emergency Medicine | Admitting: Emergency Medicine

## 2023-10-10 ENCOUNTER — Other Ambulatory Visit: Payer: Self-pay

## 2023-10-10 DIAGNOSIS — R6884 Jaw pain: Secondary | ICD-10-CM | POA: Insufficient documentation

## 2023-10-10 DIAGNOSIS — R490 Dysphonia: Secondary | ICD-10-CM | POA: Insufficient documentation

## 2023-10-10 DIAGNOSIS — Z5321 Procedure and treatment not carried out due to patient leaving prior to being seen by health care provider: Secondary | ICD-10-CM | POA: Insufficient documentation

## 2023-10-10 DIAGNOSIS — R519 Headache, unspecified: Secondary | ICD-10-CM | POA: Insufficient documentation

## 2023-10-10 MED ORDER — ACETAMINOPHEN 325 MG PO TABS: 650.0000 mg | ORAL_TABLET | Freq: Once | ORAL | Status: DC

## 2023-10-10 NOTE — ED Provider Triage Note (Signed)
 Emergency Medicine Provider Triage Evaluation Note  Deserie Dirks , a 33 y.o. female  was evaluated in triage.  Pt complains of headache, bilateral swelling of jaw, and pain with chewing. She also endorses a lump in my throat and hoarseness. No fevers, sore throat ibuprofen /tylenol .  Came here 2 weeks ago for similar symptoms but left prior to seeing provider due to wait times.  Review of Systems  Positive: headache, bilateral swelling and pain Negative: No fevers, sore throat ibuprofen /tylenol   Physical Exam  BP 113/76   Pulse 78   Temp 98.4 F (36.9 C) (Oral)   Resp 16   Ht 5' 10 (1.778 m)   Wt 72 kg   SpO2 97%   BMI 22.78 kg/m  Gen:   Awake, no distress   Resp:  Normal effort  MSK:   Moves extremities without difficulty  Other:    Medical Decision Making  Medically screening exam initiated at 3:19 PM.  Appropriate orders placed.  Trinady Engelstad was informed that the remainder of the evaluation will be completed by another provider, this initial triage assessment does not replace that evaluation, and the importance of remaining in the ED until their evaluation is complete.  Cxr and tylenol  ordered   Minnie Tinnie BRAVO, PA 10/10/23 1527

## 2023-10-10 NOTE — ED Triage Notes (Signed)
 Pt endorses temple swelling, jaw pain and headaches x2 weeks. Denies n/v/d.

## 2023-10-11 ENCOUNTER — Ambulatory Visit
Admission: EM | Admit: 2023-10-11 | Discharge: 2023-10-11 | Disposition: A | Discharge status: Home / Self Care | Payer: Medicaid Other

## 2023-10-11 DIAGNOSIS — G44209 Tension-type headache, unspecified, not intractable: Secondary | ICD-10-CM

## 2023-10-11 DIAGNOSIS — M26623 Arthralgia of bilateral temporomandibular joint: Secondary | ICD-10-CM

## 2023-10-11 DIAGNOSIS — R490 Dysphonia: Secondary | ICD-10-CM | POA: Diagnosis not present

## 2023-10-11 MED ORDER — PREDNISONE 20 MG PO TABS: 40.0000 mg | ORAL_TABLET | Freq: Every day | ORAL | 0 refills | Status: DC

## 2023-10-11 MED ORDER — TIZANIDINE HCL 4 MG PO CAPS: 4.0000 mg | ORAL_CAPSULE | Freq: Three times a day (TID) | ORAL | 0 refills | Status: DC | PRN

## 2023-10-11 NOTE — ED Provider Notes (Signed)
 [facility]    CSN: [id number] Arrival date & time: 10/11/23  1600      History   Chief Complaint Chief Complaint  Patient presents with   Headache   Pain    HPI Alexandria Frank is a 33 y.o. female.   Patient presenting today with bilateral TMJ tenderness worse with chewing and opening mouth, cracking with opening mouth in the areas and headache bilaterally.  Also having about a week of hoarseness in the voice.  Denies any congestion, postnasal drainage, fever, chills, cough associated with that.  States she has been eating candy canes frequently recently and is wondering if chewing the candycane's is irritating her jaw.  Not tried anything over-the-counter for symptoms.    Past Medical History:  Diagnosis Date   Anemia    Anxiety    Depression 10/03/2021   Thyroid  disease    Tubulovillous adenoma of colon     Patient Active Problem List   Diagnosis Date Noted   S/P partial colectomy 03/02/2022   Tubulovillous adenoma of colon 03/02/2022   Malnutrition of moderate degree 01/26/2022   Intussusception of large intestine (HCC) 01/24/2022   Anemia 04/14/2020   Hyperthyroidism 04/14/2020   Graves disease 01/24/2020    Past Surgical History:  Procedure Laterality Date   COLECTOMY N/A 01/26/2022   Procedure: PARTIAL SIGMOID COLECTOMY;  Surgeon: Eletha Boas, MD;  Location: [facility];  Service: General;  Laterality: N/A;   NO PAST SURGERIES     SMALL INTESTINE SURGERY      OB History     Gravida  1   Para      Term      Preterm      AB      Living         SAB      IAB      Ectopic      Multiple      Live Births               Home Medications    Prior to Admission medications   Medication Sig Start Date End Date Taking? Authorizing Provider  predniSONE  (DELTASONE ) 20 MG tablet Take 2 tablets (40 mg total) by mouth daily with breakfast. 10/11/23  Yes Stuart Vernell Norris, PA-C  tiZANidine  (ZANAFLEX ) 4 MG capsule Take 1 capsule (4 mg  total) by mouth 3 (three) times daily as needed for muscle spasms. Do not drink alcohol or drive while taking this medication.  May cause drowsiness 10/11/23  Yes Stuart Vernell Norris, PA-C  hydrOXYzine  (ATARAX ) 25 MG tablet TAKE 1 TABLET BY MOUTH EVERY 8 HOURS AS NEEDED FOR ANXIETY. 08/17/23   Newlin, Enobong, MD  levothyroxine  (SYNTHROID ) 75 MCG tablet Take 1 tablet (75 mcg total) by mouth daily. Patient not taking: Reported on 07/10/2023 06/28/23   Trixie File, MD  metroNIDAZOLE  (FLAGYL ) 500 MG tablet Take 500 mg by mouth 2 (two) times daily.    [provider]    Family History Family History  Problem Relation Age of Onset   Hyperthyroidism Mother    Hyperthyroidism Maternal Aunt    Hyperthyroidism Maternal Grandmother    Liver cancer Neg Hx    Esophageal cancer Neg Hx    Colon polyps Neg Hx     Social History Social History   Tobacco Use   Smoking status: Never    Passive exposure: Current   Smokeless tobacco: Never  Vaping Use   Vaping status: Never Used  Substance Use Topics  Alcohol use: Yes    Comment: tequilla 3 drinks every 2 weeks   Drug use: Yes    Types: Marijuana    Comment: occasional     Allergies   Patient has no known allergies.   Review of Systems Review of Systems Per HPI  Physical Exam Triage Vital Signs ED Triage Vitals  Encounter Vitals Group     BP 10/11/23 1636 116/74     Systolic BP Percentile --      Diastolic BP Percentile --      Pulse Rate 10/11/23 1636 75     Resp 10/11/23 1636 18     Temp 10/11/23 1636 97.9 F (36.6 C)     Temp Source 10/11/23 1636 Oral     SpO2 10/11/23 1636 97 %     Weight 10/11/23 1634 155 lb (70.3 kg)     Height 10/11/23 1634 5' 10 (1.778 m)     Head Circumference --      Peak Flow --      Pain Score 10/11/23 1633 10     Pain Loc --      Pain Education --      Exclude from Growth Chart --    No data found.  Updated Vital Signs BP 116/74 (BP Location: Left Arm)   Pulse 75   Temp  97.9 F (36.6 C) (Oral)   Resp 18   Ht 5' 10 (1.778 m)   Wt 155 lb (70.3 kg)   LMP 09/21/2023 (Exact Date)   SpO2 97%   BMI 22.24 kg/m   Visual Acuity Right Eye Distance:   Left Eye Distance:   Bilateral Distance:    Right Eye Near:   Left Eye Near:    Bilateral Near:     Physical Exam Vitals and nursing note reviewed.  Constitutional:      Appearance: Normal appearance. She is not ill-appearing.  HENT:     Head: Atraumatic.     Mouth/Throat:     Mouth: Mucous membranes are moist.     Pharynx: Oropharynx is clear. No oropharyngeal exudate or posterior oropharyngeal erythema.  Eyes:     Extraocular Movements: Extraocular movements intact.     Conjunctiva/sclera: Conjunctivae normal.  Cardiovascular:     Rate and Rhythm: Normal rate and regular rhythm.     Heart sounds: Normal heart sounds.  Pulmonary:     Effort: Pulmonary effort is normal.     Breath sounds: Normal breath sounds.  Musculoskeletal:        General: Tenderness present. Normal range of motion.     Cervical back: Normal range of motion and neck supple.     Comments: Bilateral TMJ tenderness to palpation, crepitus with opening and closing jaw bilaterally.  Skin:    General: Skin is warm and dry.  Neurological:     Mental Status: She is alert and oriented to person, place, and time.     Motor: No weakness.     Gait: Gait normal.  Psychiatric:        Mood and Affect: Mood normal.        Thought Content: Thought content normal.        Judgment: Judgment normal.      UC Treatments / Results  Labs (all labs ordered are listed, but only abnormal results are displayed) Labs Reviewed - No data to display  EKG   Radiology No results found.  Procedures Procedures (including critical care time)  Medications Ordered in UC Medications -  No data to display  Initial Impression / Assessment and Plan / UC Course  I have reviewed the triage vital signs and the nursing notes.  Pertinent labs &  imaging results that were available during my care of the patient were reviewed by me and considered in my medical decision making (see chart for details).     Suspect TMJ muscular tenderness and associated tension headache.  Treat with short course of prednisone , Zanaflex , heat, massage, soft foods.  Suspect the prednisone  will help the hoarseness as well and discussed nasal sprays for potential postnasal drainage cause.  Return for any worsening symptoms.  Final Clinical Impressions(s) / UC Diagnoses   Final diagnoses:  TMJ tenderness, bilateral  Hoarseness of voice  Tension headache     Discharge Instructions      In addition to the prescribed medications, you may do compresses to the TMJ region, massage, ibuprofen  and Tylenol  and you may use mouthguard's at night.    ED Prescriptions     Medication Sig Dispense Auth. Provider   predniSONE  (DELTASONE ) 20 MG tablet Take 2 tablets (40 mg total) by mouth daily with breakfast. 6 tablet Stuart Vernell Norris, PA-C   tiZANidine  (ZANAFLEX ) 4 MG capsule Take 1 capsule (4 mg total) by mouth 3 (three) times daily as needed for muscle spasms. Do not drink alcohol or drive while taking this medication.  May cause drowsiness 15 capsule Stuart Vernell Norris, [place]      PDMP not reviewed this encounter.   Stuart Vernell Norris, [place] 10/11/23 1728

## 2023-10-11 NOTE — Discharge Instructions (Signed)
 In addition to the prescribed medications, you may do compresses to the TMJ region, massage, ibuprofen and Tylenol and you may use mouthguard's at night.

## 2023-10-11 NOTE — ED Triage Notes (Signed)
"  Both sides of my head are hurting around temple areas with pain in my jaw mainly when eating and feeling clicking in my jaw". "My voice is hoarse today too". No fever. Symptoms began "about a week ago with hoarse voice".

## 2023-10-28 ENCOUNTER — Emergency Department (HOSPITAL_COMMUNITY)
Admission: EM | Admit: 2023-10-28 | Discharge: 2023-10-28 | Disposition: A | Discharge status: Home / Self Care | Payer: Medicaid Other | Attending: Student | Admitting: Student

## 2023-10-28 DIAGNOSIS — Z98818 Other dental procedure status: Secondary | ICD-10-CM | POA: Insufficient documentation

## 2023-10-28 DIAGNOSIS — K0889 Other specified disorders of teeth and supporting structures: Secondary | ICD-10-CM | POA: Insufficient documentation

## 2023-10-28 DIAGNOSIS — E039 Hypothyroidism, unspecified: Secondary | ICD-10-CM | POA: Diagnosis not present

## 2023-10-28 MED ORDER — HYDROCODONE-ACETAMINOPHEN 5-325 MG PO TABS: 2.0000 | ORAL_TABLET | Freq: Four times a day (QID) | ORAL | 0 refills | Status: DC | PRN

## 2023-10-28 MED ORDER — KETOROLAC TROMETHAMINE 15 MG/ML IJ SOLN
15.0000 mg | Freq: Once | INTRAMUSCULAR | Status: AC
  Administered 2023-10-28: 15 mg via INTRAMUSCULAR
  Filled 2023-10-28: qty 1

## 2023-10-28 MED ORDER — NAPROXEN 375 MG PO TABS: 375.0000 mg | ORAL_TABLET | Freq: Two times a day (BID) | ORAL | 0 refills | Status: DC

## 2023-10-28 MED ORDER — IBUPROFEN 200 MG PO TABS
400.0000 mg | ORAL_TABLET | Freq: Once | ORAL | Status: AC | PRN
  Administered 2023-10-28: 400 mg via ORAL
  Filled 2023-10-28: qty 2

## 2023-10-28 NOTE — Discharge Instructions (Addendum)
(  See attached instructions)  [email or web address] ([email or web address])

## 2023-10-28 NOTE — ED Provider Notes (Signed)
[place] EMERGENCY DEPARTMENT AT [facility] Provider Note  CSN: [id number] Arrival date & time: 10/28/23 0025  Chief Complaint(s) Dental Problem  HPI Alexandria Frank is a 33 y.o. female with PMH anemia, anxiety, Graves' disease, depression, intussusception status post partial colectomy who presents emergency department for evaluation of bleeding, pain and swelling at the site of the dental extraction.  Patient had her wisdom tooth removed this morning and reportedly was discharged without pain medicine or post care instructions.  She arrives with a gauze pad in her mouth and very minimal bleeding.  Currently denies fever, chest pain, shortness of breath, abdominal pain, nausea, vomiting or other systemic symptoms.   Past Medical History Past Medical History:  Diagnosis Date   Anemia    Anxiety    Depression 10/03/2021   Thyroid disease    Tubulovillous adenoma of colon    Patient Active Problem List   Diagnosis Date Noted   S/P partial colectomy 03/02/2022   Tubulovillous adenoma of colon 03/02/2022   Malnutrition of moderate degree 01/26/2022   Intussusception of large intestine (HCC) 01/24/2022   Anemia 04/14/2020   Hyperthyroidism 04/14/2020   Graves disease 01/24/2020   Home Medication(s) Prior to Admission medications   Medication Sig Start Date End Date Taking? Authorizing Provider  HYDROcodone-acetaminophen (NORCO/VICODIN) 5-325 MG tablet Take 2 tablets by mouth every 6 (six) hours as needed (breakthrough pain only). 10/28/23  Yes Aeneas Longsworth, MD  naproxen (NAPROSYN) 375 MG tablet Take 1 tablet (375 mg total) by mouth 2 (two) times daily. 10/28/23  Yes Thaily Hackworth, MD  hydrOXYzine (ATARAX) 25 MG tablet TAKE 1 TABLET BY MOUTH EVERY 8 HOURS AS NEEDED FOR ANXIETY. 08/17/23   Hoy Register, MD  levothyroxine (SYNTHROID) 75 MCG tablet Take 1 tablet (75 mcg total) by mouth daily. Patient not taking: Reported on 07/10/2023 06/28/23   Carlus Pavlov, MD   metroNIDAZOLE (FLAGYL) 500 MG tablet Take 500 mg by mouth 2 (two) times daily.    [provider]  predniSONE (DELTASONE) 20 MG tablet Take 2 tablets (40 mg total) by mouth daily with breakfast. 10/11/23   Particia Nearing, PA-C  tiZANidine (ZANAFLEX) 4 MG capsule Take 1 capsule (4 mg total) by mouth 3 (three) times daily as needed for muscle spasms. Do not drink alcohol or drive while taking this medication.  May cause drowsiness 10/11/23   Particia Nearing, PA-C                                                                                                                                    Past Surgical History Past Surgical History:  Procedure Laterality Date   COLECTOMY N/A 01/26/2022   Procedure: PARTIAL SIGMOID COLECTOMY;  Surgeon: Darnell Level, MD;  Location: [facility];  Service: General;  Laterality: N/A;   NO PAST SURGERIES     SMALL INTESTINE SURGERY     Family History Family History  Problem  Relation Age of Onset   Hyperthyroidism Mother    Hyperthyroidism Maternal Aunt    Hyperthyroidism Maternal Grandmother    Liver cancer Neg Hx    Esophageal cancer Neg Hx    Colon polyps Neg Hx     Social History Social History   Tobacco Use   Smoking status: Never    Passive exposure: Current   Smokeless tobacco: Never  Vaping Use   Vaping status: Never Used  Substance Use Topics   Alcohol use: Yes    Comment: tequilla 3 drinks every 2 weeks   Drug use: Yes    Types: Marijuana    Comment: occasional   Allergies Patient has no known allergies.  Review of Systems Review of Systems  HENT:  Positive for dental problem and facial swelling.     Physical Exam Vital Signs  I have reviewed the triage vital signs BP (!) 128/90   Pulse 69   Temp 97.9 F (36.6 C) (Oral)   Resp 18   LMP 10/25/2023 (Exact Date)   SpO2 99%   Physical Exam Vitals and nursing note reviewed.  Constitutional:      General: She is not in acute distress.    Appearance: She is  well-developed.  HENT:     Head: Normocephalic and atraumatic.     Comments: Mild bleeding at tooth socket #1 Eyes:     Conjunctiva/sclera: Conjunctivae normal.  Cardiovascular:     Rate and Rhythm: Normal rate and regular rhythm.     Heart sounds: No murmur heard. Pulmonary:     Effort: Pulmonary effort is normal. No respiratory distress.     Breath sounds: Normal breath sounds.  Abdominal:     Palpations: Abdomen is soft.     Tenderness: There is no abdominal tenderness.  Musculoskeletal:        General: No swelling.     Cervical back: Neck supple.  Skin:    General: Skin is warm and dry.     Capillary Refill: Capillary refill takes less than 2 seconds.  Neurological:     Mental Status: She is alert.  Psychiatric:        Mood and Affect: Mood normal.     ED Results and Treatments Labs (all labs ordered are listed, but only abnormal results are displayed) Labs Reviewed - No data to display                                                                                                                        Radiology No results found.  Pertinent labs & imaging results that were available during my care of the patient were reviewed by me and considered in my medical decision making (see MDM for details).  Medications Ordered in ED Medications  ibuprofen (ADVIL) tablet 400 mg (400 mg Oral Given 10/28/23 0046)  ketorolac (TORADOL) 15 MG/ML injection 15 mg (15 mg Intramuscular Given 10/28/23 0330)  Procedures Procedures  (including critical care time)  Medical Decision Making / ED Course   This patient presents to the ED for concern of dental pain, this involves an extensive number of treatment options, and is a complaint that carries with it a high risk of complications and morbidity.  The differential diagnosis includes dental caries,  pulpitis, periapical abscess, pericoronitis, tooth fracture, Ludwig's, submandibular abscess  MDM: Patient seen emergency room for evaluation of bleeding and pain at the site of a tooth extraction.  Physical exam with some very mild bleeding at the remaining socket site of where tooth #1 was extracted.  Swelling is minimal in the maxilla.  Patient presentation is consistent with normal expected bleeding post extraction and bleeding is well-controlled with rotating gauze pads.  She is pain controlled and I did print discharge instructions specific to extraction aftercare.  She will contact her dentist for further follow-up.  At this time she does not meet inpatient criteria for admission and will be discharged with outpatient follow-up.  Return precautions given of which she voiced understanding   Additional history obtained:  -External records from outside source obtained and reviewed including: Chart review including previous notes, labs, imaging, consultation notes     Medicines ordered and prescription drug management: Meds ordered this encounter  Medications   ibuprofen (ADVIL) tablet 400 mg   ketorolac (TORADOL) 15 MG/ML injection 15 mg   naproxen (NAPROSYN) 375 MG tablet    Sig: Take 1 tablet (375 mg total) by mouth 2 (two) times daily.    Dispense:  20 tablet    Refill:  0   HYDROcodone-acetaminophen (NORCO/VICODIN) 5-325 MG tablet    Sig: Take 2 tablets by mouth every 6 (six) hours as needed (breakthrough pain only).    Dispense:  10 tablet    Refill:  0    -I have reviewed the patients home medicines and have made adjustments as needed  Critical interventions none    Social Determinants of Health:  Factors impacting patients care include: none   Reevaluation: After the interventions noted above, I reevaluated the patient and found that they have :improved  Co morbidities that complicate the patient evaluation  Past Medical History:  Diagnosis Date   Anemia     Anxiety    Depression 10/03/2021   Thyroid disease    Tubulovillous adenoma of colon       Dispostion: I considered admission for this patient, but at this time she does not meet inpatient criteria for admission and will be discharged with outpatient follow-up     Final Clinical Impression(s) / ED Diagnoses Final diagnoses:  Status post wisdom tooth extraction     @PCDICTATION @    Glendora Score, MD 10/28/23 [phone]

## 2023-10-28 NOTE — ED Triage Notes (Signed)
Pt reports left upper wisdom tooth pulled, pt reports has not stopped bleeding since , c/o ha with it. Pt airway clear, bleeding controlled, new gauze given

## 2023-11-06 ENCOUNTER — Ambulatory Visit: Payer: Medicaid Other | Admitting: Internal Medicine

## 2023-11-25 ENCOUNTER — Encounter: Payer: Self-pay | Admitting: Internal Medicine

## 2023-11-26 ENCOUNTER — Other Ambulatory Visit: Payer: Self-pay | Admitting: Family Medicine

## 2023-11-26 DIAGNOSIS — F419 Anxiety disorder, unspecified: Secondary | ICD-10-CM

## 2023-11-26 MED ORDER — LEVOTHYROXINE SODIUM 75 MCG PO TABS: 75.0000 ug | ORAL_TABLET | Freq: Every day | ORAL | 0 refills | Status: DC

## 2023-11-27 NOTE — Telephone Encounter (Signed)
 Requested Prescriptions  Pending Prescriptions Disp Refills   hydrOXYzine (ATARAX) 25 MG tablet [Pharmacy Med Name: HYDROXYZINE HCL 25 MG TABLET] 60 tablet 1    Sig: TAKE 1 TABLET BY MOUTH EVERY 8 HOURS AS NEEDED FOR ANXIETY     Ear, Nose, and Throat:  Antihistamines 2 Passed - 11/27/2023 11:57 AM      Passed - Cr in normal range and within 360 days    Creatinine, Ser  Date Value Ref Range Status  07/10/2023 1.00 0.57 - 1.00 mg/dL Final         Passed - Valid encounter within last 12 months    Recent Outpatient Visits           4 months ago Encounter for annual physical exam   [facility] [place]. [facility] Claiborne Rigg, NP   1 year ago Intussusception of large intestine [facility])   [facility] [place]. [facility] Claiborne Rigg, NP   3 years ago Encounter for annual physical exam   [facility] Of [place]. [facility] Claiborne Rigg, NP   3 years ago Encounter to establish care   [facility] - [facility] Dept Of [place]. [facility] Claiborne Rigg, NP   3 years ago Encounter to establish care    [facility] [place]. [facility] Zonia Kief, [place], NP

## 2023-11-30 ENCOUNTER — Ambulatory Visit: Payer: Medicaid Other | Admitting: Internal Medicine

## 2023-12-05 ENCOUNTER — Encounter: Payer: Self-pay | Admitting: Nurse Practitioner

## 2023-12-06 ENCOUNTER — Other Ambulatory Visit: Payer: Self-pay | Admitting: Nurse Practitioner

## 2023-12-06 DIAGNOSIS — L659 Nonscarring hair loss, unspecified: Secondary | ICD-10-CM

## 2024-01-22 ENCOUNTER — Ambulatory Visit (INDEPENDENT_AMBULATORY_CARE_PROVIDER_SITE_OTHER): Payer: Medicaid Other | Admitting: Internal Medicine

## 2024-01-22 ENCOUNTER — Encounter: Payer: Self-pay | Admitting: Internal Medicine

## 2024-01-22 VITALS — BP 118/60 | HR 79 | Ht 70.0 in | Wt 175.0 lb

## 2024-01-22 DIAGNOSIS — Z91199 Patient's noncompliance with other medical treatment and regimen due to unspecified reason: Secondary | ICD-10-CM | POA: Diagnosis not present

## 2024-01-22 DIAGNOSIS — E890 Postprocedural hypothyroidism: Secondary | ICD-10-CM | POA: Diagnosis not present

## 2024-01-22 NOTE — Progress Notes (Addendum)
 Patient ID: Alexandria Frank, female   DOB: Mar 21, 1991, 33 y.o.   MRN: [id number]  HPI  Alexandria Frank is a 33 y.o.-year-old female, initially referred by her PCP, Collins Dean, NP, returning for follow-up for recurrent Graves' disease despite RAI ablation.  She previously saw Dr. [place] Hacker, last visit with him 09/15/2020.  Last visit with me 7 months ago.  Interim history: No cold intolerance, no increased fatigue. She has hair loss, skin changes. She has constipation, but takes iron. She gained ~20lbs in last 3 months. She started a new job since last visit.  Reviewed and addended history: Pt. has been dx with Graves' disease in 2021 after she presented with nausea, vomiting, wt loss (25 lbs in 3-4 mo), tremors, palpitations, SOB, and goiter. She had a thyroid  uptake and scan (06/02/2020) showing 81.5% uptake and homogeneous scan.   She was initially on methimazole  and Inderal . She had RAI treatment on 06/24/2020. She developed post ablative hypothyroidism afterwards, with a TSH of 5.2 in 08/2020, but she could not tolerate levothyroxine  100 mcg due to return of her symptoms, so she stopped the medication.   In 10/2020, TSH was suppressed off the medication. In 01/2021: TSH was undetectable so we started methimazole  5 mg twice a day. At that time, she complained of: - + itching - + heat intolerance - + hair loss (new) - no palpitations now, had these initially - no SOB now, had this initially - + insomnia - + anxiety- on Atarax  - + hyperdefecation She did not return for labs as advised... She was off and on MMI before our visit from 09/2021, and completely off 2 weeks prior to this visit. In 09/2021: TSH was low, so I advised her to start methimazole  5 mg daily In 12/2021, TSH was normal.  In 05/2022, MMI was decreased to 5 mg every other day.  She did not return for labs...  On 04/24/2023, she had a thyroid  uptake consistent with Graves' disease:  The 4 hour radioactive uptake of iodine by  the thyroid  gland is 58.1% (normal range 5-15%). The 24 hour uptake is 57.3% (normal range 10-30%).  On 05/24/2023, she had a second RAI treatment  She developed postablative hypothyroidism after the second RAI treatment.  She is on levothyroxine  75 mcg daily. She takes this: - in am - fasting - at least 1-2h from b'fast - no calcium - + iron in Geritol in the pm - no multivitamins - no PPIs - not on Biotin  She did not return for labs as advised in 1.5 months after starting LT4...  I reviewed pt's thyroid  tests: Lab Results  Component Value Date   TSH 34.77 (H) 06/27/2023   TSH 0.00 Repeated and verified X2. (L) 04/03/2023   TSH 5.85 (H) 05/22/2022   TSH 2.642 01/24/2022   TSH 0.12 (L) 09/22/2021   TSH <0.01 (L) 03/01/2021   TSH 0.01 (L) 10/06/2020   TSH 5.20 (H) 08/18/2020   TSH <0.01 (L) 04/19/2020   TSH <0.005 (L) 12/15/2019   FREET4 0.22 (L) 06/27/2023   FREET4 1.27 04/03/2023   FREET4 0.61 05/22/2022   FREET4 0.54 (L) 09/22/2021   FREET4 1.70 (H) 03/01/2021   FREET4 1.45 10/06/2020   FREET4 0.20 (L) 08/18/2020   FREET4 3.29 (H) 04/19/2020   FREET4 >7.77 (H) 12/15/2019   T3FREE 2.0 (L) 06/27/2023   T3FREE 3.7 04/03/2023   T3FREE 5.5 (H) 05/22/2022   T3FREE 2.9 09/22/2021   T3FREE 5.1 (H) 03/01/2021   Lab Results  Component Value Date   T3FREE 2.0 (L) 06/27/2023   T3FREE 3.7 04/03/2023   T3FREE 5.5 (H) 05/22/2022   T3FREE 2.9 09/22/2021   T3FREE 5.1 (H) 03/01/2021    Antithyroid antibodies: Lab Results  Component Value Date   TSI 395 (H) 04/03/2023   TSI 223 (H) 05/22/2022   TSI 491 (H) 03/01/2021    No results found for: "THGAB" No components found for: "TPOAB"  Pt denies feeling nodules in neck, hoarseness, dysphagia/odynophagia.  Pt. also has a history of M, M aunt, MGM.No FH of thyroid  cancer.  No h/o radiation tx to head or neck other than RAI tx. No recent use of iodine supplements. No Biotin.  She has a history of intussusception in  12/2021.  ROS:  + See HPI  Past Medical History:  Diagnosis Date   Anemia    Anxiety    Depression 10/03/2021   Thyroid  disease    Tubulovillous adenoma of colon    Past Surgical History:  Procedure Laterality Date   COLECTOMY N/A 01/26/2022   Procedure: PARTIAL SIGMOID COLECTOMY;  Surgeon: Oralee Billow, MD;  Location: [facility];  Service: General;  Laterality: N/A;   NO PAST SURGERIES     SMALL INTESTINE SURGERY     Social History   Socioeconomic History   Marital status: Single    Spouse name: Not on file   Number of children: 0   Years of education: Not on file   Highest education level: GED or equivalent  Occupational History   Occupation: Customer service/call center  Tobacco Use   Smoking status: Never    Passive exposure: Current   Smokeless tobacco: Never  Vaping Use   Vaping status: Never Used  Substance and Sexual Activity   Alcohol use: Yes    Comment: tequilla 3 drinks every 2 weeks   Drug use: Yes    Types: Marijuana    Comment: occasional   Sexual activity: Yes    Birth control/protection: None  Other Topics Concern   Not on file  Social History Narrative   Not on file   Social Drivers of Health   Financial Resource Strain: Low Risk  (07/10/2023)   Overall Financial Resource Strain (CARDIA)    Difficulty of Paying Living Expenses: Not very hard  Food Insecurity: Food Insecurity Present (07/10/2023)   Hunger Vital Sign    Worried About Running Out of Food in the Last Year: Sometimes true    Ran Out of Food in the Last Year: Sometimes true  Transportation Needs: No Transportation Needs (07/10/2023)   PRAPARE - Administrator, Civil Service (Medical): No    Lack of Transportation (Non-Medical): No  Physical Activity: Unknown (07/10/2023)   Exercise Vital Sign    Days of Exercise per Week: 0 days    Minutes of Exercise per Session: Not on file  Stress: Stress Concern Present (07/10/2023)   [employer] of Occupational Health -  Occupational Stress Questionnaire    Feeling of Stress : To some extent  Social Connections: Unknown (07/10/2023)   Social Connection and Isolation Panel [NHANES]    Frequency of Communication with Friends and Family: Once a week    Frequency of Social Gatherings with Friends and Family: Not on file    Attends Religious Services: Never    Database administrator or Organizations: No    Attends Engineer, structural: Not on file    Marital Status: Living with partner  Intimate Partner Violence: Not on  file   Current Outpatient Medications on File Prior to Visit  Medication Sig Dispense Refill   HYDROcodone -acetaminophen  (NORCO/VICODIN) 5-325 MG tablet Take 2 tablets by mouth every 6 (six) hours as needed (breakthrough pain only). 10 tablet 0   hydrOXYzine  (ATARAX ) 25 MG tablet TAKE 1 TABLET BY MOUTH EVERY 8 HOURS AS NEEDED FOR ANXIETY 60 tablet 1   levothyroxine  (SYNTHROID ) 75 MCG tablet Take 1 tablet (75 mcg total) by mouth daily. 60 tablet 0   metroNIDAZOLE  (FLAGYL ) 500 MG tablet Take 500 mg by mouth 2 (two) times daily.     naproxen  (NAPROSYN ) 375 MG tablet Take 1 tablet (375 mg total) by mouth 2 (two) times daily. 20 tablet 0   predniSONE  (DELTASONE ) 20 MG tablet Take 2 tablets (40 mg total) by mouth daily with breakfast. 6 tablet 0   tiZANidine  (ZANAFLEX ) 4 MG capsule Take 1 capsule (4 mg total) by mouth 3 (three) times daily as needed for muscle spasms. Do not drink alcohol or drive while taking this medication.  May cause drowsiness 15 capsule 0   No current facility-administered medications on file prior to visit.   No Known Allergies Family History  Problem Relation Age of Onset   Hyperthyroidism Mother    Hyperthyroidism Maternal Aunt    Hyperthyroidism Maternal Grandmother    Liver cancer Neg Hx    Esophageal cancer Neg Hx    Colon polyps Neg Hx    PE: BP 118/60   Pulse 79   Ht 5\' 10"  (1.778 m)   Wt 175 lb (79.4 kg)   SpO2 99%   BMI 25.11 kg/m  Wt Readings  from Last 10 Encounters:  01/22/24 175 lb (79.4 kg)  10/11/23 155 lb (70.3 kg)  10/10/23 158 lb 11.7 oz (72 kg)  09/30/23 160 lb (72.6 kg)  07/10/23 168 lb 12.8 oz (76.6 kg)  06/27/23 169 lb (76.7 kg)  04/03/23 158 lb 6.4 oz (71.8 kg)  10/16/22 145 lb (65.8 kg)  08/07/22 145 lb (65.8 kg)  05/22/22 146 lb 6.4 oz (66.4 kg)   Constitutional: normal weight, in NAD Eyes: EOMI, no exophthalmos ENT: no thyromegaly, no cervical lymphadenopathy Cardiovascular: RRR, No MRG Respiratory: CTA B Musculoskeletal: no deformities Skin: no rashes Neurological: no tremor with outstretched hands  ASSESSMENT: 1.  Postoperative hypothyroidism - after RAI treatment for Graves' disease  3.  History of noncompliance with the recommended treatment plan  PLAN:  1. And 2. Patient with history of thyrotoxicosis diagnosed in 2021 when she presented with weight loss, palpitations, tremors, shortness of breath, and was found to have a goiter.  She was initially started on Inderal  and methimazole  and then had RAI treatment in 06/2020.  She felt better after RAI treatment but TSH returned elevated and she was started on levothyroxine  100 mcg daily.  She did not tolerate this well, with return of the previous symptoms.  At that time, she stopped levothyroxine .  A TSH obtained in 10/2020 was suppressed.  I first saw the patient in 01/2021, while off levothyroxine .  She had weight loss, palpitations, anxiety, insomnia.  We discussed that her RAI treatment most likely did not work and we added back methimazole .  She felt that this may have caused itching and anxiety in the past but symptoms did not resolve completely even after coming off the medication.  I advised her that thyrotoxicosis itself could have caused the symptoms and not necessarily the thionamide.  We started back on methimazole , which she tolerated well.  However,  her TFTs remained thyrotoxic and her TSI's were still elevated and she still had occasional  itching.  She was contemplating a pregnancy and we discussed about definitive treatment of her thyrotoxicosis/Graves' disease before a possible pregnancy. We performed a thyroid  uptake first (04/24/2023), and then a second RAI treatment (05/24/2023).  She developed postablative hypothyroidism afterwards. - latest thyroid  labs reviewed with pt. >> TSH was quite elevated at last visit: Lab Results  Component Value Date   TSH 34.77 (H) 06/27/2023  - we started on LT4 75 mcg daily - pt feels good on this dose.  Before last visit, she was able to gain 11 pounds in the previous 2 months, about which she was happy.  She lost weight afterwards, approximately 14 pounds possibly due to resolution of hypothyroidism, but in the last 3 months she gained 20 lbs back.  She also has hair loss and skin changes and we discussed that these could be related to hypothyroidism.  She has constipation but this is most likely related to iron therapy. - we discussed about taking the thyroid  hormone every day, with water, >30 minutes before breakfast, separated by >4 hours from acid reflux medications, calcium, iron, multivitamins. Pt. is taking it correctly.  I advised her that she could double the dose the next day if she is to miss a dose. - will check thyroid  tests today: TSH and fT4 - If labs are abnormal, she will need to return for repeat TFTs in 1.5 months - Otherwise, we will see her back in 3-4 months   2.  History of noncompliance with the recommended treatment plan -She has a history of being noncompliant with visits and having labs as recommended between visits.  Also, she was not taking methimazole  consistently in the past. -We did discuss at last visit about the importance of taking levothyroxine  consistently every day after RAI treatment, since the risk of postablative hypothyroidism was almost 100% after a second RAI treatment  -We also discussed that after every change in dose, will need to have labs in about 5  to 6 weeks -We started LT4 in 06/2023.  She did not return for labs in 5 to 6 weeks after starting the medication... We again discussed about the importance to do so at today's visit.  Needs refills.  Component     Latest Ref Rng 01/22/2024  TSH     mIU/L 37.72 (H)   T4,Free(Direct)     0.8 - 1.8 ng/dL 0.9   TSH is extremely high.  Will increase the dose of levothyroxine  to 100 mcg daily and have her back for labs in 5 weeks.  Emilie Harden, MD PhD [facility]

## 2024-01-22 NOTE — Patient Instructions (Addendum)
 Please continue levothyroxine  75 mcg daily.  Please take this: every day, with water, at least 30 minutes before breakfast, separated by at least 4 hours from: - acid reflux medications - calcium - iron - multivitamins  If we need to change the dose of levothyroxine  after the labs returned today, remember that we need to recheck the labs in 1.5 months.  This will be just a lab appointment.  Please return in 3-4 months.

## 2024-01-23 ENCOUNTER — Encounter: Payer: Self-pay | Admitting: Internal Medicine

## 2024-01-23 LAB — T4, FREE: Free T4: 0.9 ng/dL (ref 0.8–1.8)

## 2024-01-23 LAB — TSH: TSH: 37.72 m[IU]/L — ABNORMAL HIGH

## 2024-01-23 MED ORDER — LEVOTHYROXINE SODIUM 100 MCG PO TABS: 100.0000 ug | ORAL_TABLET | Freq: Every day | ORAL | 3 refills | Status: DC

## 2024-01-23 NOTE — Addendum Note (Signed)
 Addended by: Emilie Harden on: 01/23/2024 09:32 AM   Modules accepted: Orders

## 2024-03-04 ENCOUNTER — Other Ambulatory Visit

## 2024-03-05 ENCOUNTER — Ambulatory Visit: Payer: Self-pay | Admitting: Internal Medicine

## 2024-03-05 LAB — T4, FREE: Free T4: 1.4 ng/dL (ref 0.8–1.8)

## 2024-03-05 LAB — TSH: TSH: 2.89 m[IU]/L

## 2024-05-07 ENCOUNTER — Other Ambulatory Visit: Payer: Self-pay | Admitting: Nurse Practitioner

## 2024-05-07 DIAGNOSIS — F419 Anxiety disorder, unspecified: Secondary | ICD-10-CM

## 2024-05-13 ENCOUNTER — Encounter: Payer: Self-pay | Admitting: Nurse Practitioner

## 2024-05-13 ENCOUNTER — Ambulatory Visit (INDEPENDENT_AMBULATORY_CARE_PROVIDER_SITE_OTHER): Admitting: Internal Medicine

## 2024-05-13 ENCOUNTER — Encounter: Payer: Self-pay | Admitting: Internal Medicine

## 2024-05-13 VITALS — BP 110/64 | HR 64 | Ht 70.0 in | Wt 177.4 lb

## 2024-05-13 DIAGNOSIS — E890 Postprocedural hypothyroidism: Secondary | ICD-10-CM

## 2024-05-13 LAB — T4, FREE: Free T4: 1.5 ng/dL (ref 0.8–1.8)

## 2024-05-13 LAB — TSH: TSH: 1.86 m[IU]/L

## 2024-05-13 NOTE — Patient Instructions (Addendum)
 Please continue levothyroxine  100 mcg daily.  Please take this: every day, with water, at least 30 minutes before breakfast, separated by at least 4 hours from: - acid reflux medications - calcium - iron - multivitamins  Please return in 3-4 months.

## 2024-05-13 NOTE — Progress Notes (Addendum)
 Patient ID: Alexandria Frank, female   DOB: 1991-09-05, 33 y.o.   MRN: [id number]  HPI  Alexandria Frank is a 33 y.o.-year-old female, initially referred by her PCP, Theotis Haze ORN, NP, returning for follow-up for recurrent Graves' disease despite RAI ablation.  She previously saw Dr. Kassie, but last visit with me 4 months ago.  Interim history: No tremors, palpitations, but she does have some heat intolerance. Last visit she had hair loss, skin changes, constipation(on iron) and she also gained ~20lbs in the previous 3 months.  No significant weight gain since last visit. 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  Reviewed and addended history: Pt. has been dx with Graves' disease in 2021 after she presented with nausea, vomiting, wt loss (25 lbs in 3-4 mo), tremors, palpitations, SOB, and goiter. She had a thyroid  uptake and scan (06/02/2020) showing 81.5% uptake and homogeneous scan.   She was initially on methimazole  and Inderal . She had RAI treatment on 06/24/2020. She developed post ablative hypothyroidism afterwards, with a TSH of 5.2 in 08/2020, but she could not tolerate levothyroxine  100 mcg due to return of her symptoms, so she stopped the medication.   In 10/2020, TSH was suppressed off the medication. In 01/2021: TSH was undetectable so we started methimazole  5 mg twice a day. At that time, she complained of: - + itching - + heat intolerance - + hair loss (new) - no palpitations now, had these initially - no SOB now, had this initially - + insomnia - + anxiety- on Atarax  - + hyperdefecation She did not return for labs as advised... She was off and on MMI before our visit from 09/2021, and completely off 2 weeks prior to this visit. In 09/2021: TSH was low, so I advised her to start methimazole  5 mg daily In 12/2021, TSH was normal.  In 05/2022, MMI was decreased to 5 mg every other day.  She did not return for labs...  On 04/24/2023, she had a thyroid  uptake consistent with Graves'  disease:  The 4 hour radioactive uptake of iodine by the thyroid  gland is 58.1% (normal range 5-15%). The 24 hour uptake is 57.3% (normal range 10-30%).  On 05/24/2023, she had a second RAI treatment  She developed postablative hypothyroidism after the second RAI treatment.  She is on levothyroxine  100 mcg daily, dose increased 01/2024: - in am - fasting - at least 2-3h from b'fast - no calcium - she was iron in the pm >> now off, but will restart - no multivitamins - no PPIs - not on Biotin  I reviewed pt's thyroid  tests: Lab Results  Component Value Date   TSH 2.89 03/04/2024   TSH 37.72 (H) 01/22/2024   TSH 34.77 (H) 06/27/2023   TSH 0.00 Repeated and verified X2. (L) 04/03/2023   TSH 5.85 (H) 05/22/2022   TSH 2.642 01/24/2022   TSH 0.12 (L) 09/22/2021   TSH <0.01 (L) 03/01/2021   TSH 0.01 (L) 10/06/2020   TSH 5.20 (H) 08/18/2020   FREET4 1.4 03/04/2024   FREET4 0.9 01/22/2024   FREET4 0.22 (L) 06/27/2023   FREET4 1.27 04/03/2023   FREET4 0.61 05/22/2022   FREET4 0.54 (L) 09/22/2021   FREET4 1.70 (H) 03/01/2021   FREET4 1.45 10/06/2020   FREET4 0.20 (L) 08/18/2020   FREET4 3.29 (H) 04/19/2020   T3FREE 2.0 (L) 06/27/2023   T3FREE 3.7 04/03/2023   T3FREE 5.5 (H) 05/22/2022   T3FREE 2.9 09/22/2021   T3FREE 5.1 (H) 03/01/2021   Lab  Results  Component Value Date   T3FREE 2.0 (L) 06/27/2023   T3FREE 3.7 04/03/2023   T3FREE 5.5 (H) 05/22/2022   T3FREE 2.9 09/22/2021   T3FREE 5.1 (H) 03/01/2021    Antithyroid antibodies: Lab Results  Component Value Date   TSI 395 (H) 04/03/2023   TSI 223 (H) 05/22/2022   TSI 491 (H) 03/01/2021    No results found for: THGAB No components found for: TPOAB  Pt denies feeling nodules in neck, hoarseness, dysphagia/odynophagia.  Pt. also has a history of M, M aunt, MGM.No FH of thyroid  cancer.  No h/o radiation tx to head or neck other than RAI tx. No recent use of iodine supplements. No Biotin.  She has a history of  intussusception in 12/2021.  ROS:  + See HPI  Past Medical History:  Diagnosis Date   Anemia    Anxiety    Depression 10/03/2021   Thyroid  disease    Tubulovillous adenoma of colon    Past Surgical History:  Procedure Laterality Date   COLECTOMY N/A 01/26/2022   Procedure: PARTIAL SIGMOID COLECTOMY;  Surgeon: Eletha Boas, MD;  Location: [facility];  Service: General;  Laterality: N/A;   NO PAST SURGERIES     SMALL INTESTINE SURGERY     Social History   Socioeconomic History   Marital status: Single    Spouse name: Not on file   Number of children: 0   Years of education: Not on file   Highest education level: GED or equivalent  Occupational History   Occupation: Customer service/call center  Tobacco Use   Smoking status: Never    Passive exposure: Current   Smokeless tobacco: Never  Vaping Use   Vaping status: Never Used  Substance and Sexual Activity   Alcohol use: Yes    Comment: tequilla 3 drinks every 2 weeks   Drug use: Yes    Types: Marijuana    Comment: occasional   Sexual activity: Yes    Birth control/protection: None  Other Topics Concern   Not on file  Social History Narrative   Not on file   Social Drivers of Health   Financial Resource Strain: Low Risk  (07/10/2023)   Overall Financial Resource Strain (CARDIA)    Difficulty of Paying Living Expenses: Not very hard  Food Insecurity: Food Insecurity Present (07/10/2023)   Hunger Vital Sign    Worried About Running Out of Food in the Last Year: Sometimes true    Ran Out of Food in the Last Year: Sometimes true  Transportation Needs: No Transportation Needs (07/10/2023)   PRAPARE - Administrator, Civil Service (Medical): No    Lack of Transportation (Non-Medical): No  Physical Activity: Unknown (07/10/2023)   Exercise Vital Sign    Days of Exercise per Week: 0 days    Minutes of Exercise per Session: Not on file  Stress: Stress Concern Present (07/10/2023)   [employer] of  Occupational Health - Occupational Stress Questionnaire    Feeling of Stress : To some extent  Social Connections: Unknown (07/10/2023)   Social Connection and Isolation Panel    Frequency of Communication with Friends and Family: Once a week    Frequency of Social Gatherings with Friends and Family: Not on file    Attends Religious Services: Never    Database administrator or Organizations: No    Attends Banker Meetings: Not on file    Marital Status: Living with partner  Intimate Partner Violence: Not  on file   Current Outpatient Medications on File Prior to Visit  Medication Sig Dispense Refill   hydrOXYzine  (ATARAX ) 25 MG tablet TAKE 1 TABLET BY MOUTH EVERY 8 HOURS AS NEEDED FOR ANXIETY 60 tablet 0   levothyroxine  (SYNTHROID ) 100 MCG tablet Take 1 tablet (100 mcg total) by mouth daily. 45 tablet 3   No current facility-administered medications on file prior to visit.   No Known Allergies Family History  Problem Relation Age of Onset   Hyperthyroidism Mother    Hyperthyroidism Maternal Aunt    Hyperthyroidism Maternal Grandmother    Liver cancer Neg Hx    Esophageal cancer Neg Hx    Colon polyps Neg Hx    PE: BP 110/64   Pulse 64   Ht 5' 10 (1.778 m)   Wt 177 lb 6.4 oz (80.5 kg)   BMI 25.45 kg/m  Wt Readings from Last 10 Encounters:  05/13/24 177 lb 6.4 oz (80.5 kg)  01/22/24 175 lb (79.4 kg)  10/11/23 155 lb (70.3 kg)  10/10/23 158 lb 11.7 oz (72 kg)  09/30/23 160 lb (72.6 kg)  07/10/23 168 lb 12.8 oz (76.6 kg)  06/27/23 169 lb (76.7 kg)  04/03/23 158 lb 6.4 oz (71.8 kg)  10/16/22 145 lb (65.8 kg)  08/07/22 145 lb (65.8 kg)   Constitutional: normal weight, in NAD Eyes: EOMI, no exophthalmos ENT: no thyromegaly, no cervical lymphadenopathy Cardiovascular: RRR, No MRG Respiratory: CTA B Musculoskeletal: no deformities Skin: no rashes Neurological: no tremor with outstretched hands  ASSESSMENT: 1.  Postoperative hypothyroidism - after RAI  treatment for Graves' disease  3.  History of noncompliance with the recommended treatment plan - She has a history of being noncompliant with visits and having labs as recommended between visits.  Also, she was not taking methimazole  consistently in the past. - We did discuss at last visits about the importance of taking levothyroxine  consistently every day after RAI treatment, since the risk of postablative hypothyroidism was almost 100% after a second RAI treatment  - We again discussed that after every changing dose, we will need to have labs in about 5 to 6 weeks.  PLAN:  1. And 2. Patient with history of thyrotoxicosis diagnosed in 2021 when she presented with weight loss, palpitations, tremors, shortness of breath, and was found to have a goiter.  She was initially started on Inderal  and methimazole  and then had RAI treatment in 06/2020.  She felt better after RAI treatment but TSH returned elevated and she was started on levothyroxine  100 mcg daily.  She did not tolerate this well, with return of the previous symptoms.  At that time, she stopped levothyroxine .  A TSH obtained in 10/2020 was suppressed.  I first saw the patient in 01/2021, while off levothyroxine .  She had weight loss, palpitations, anxiety, insomnia.  We discussed that her RAI treatment most likely did not work and we added back methimazole .  She felt that this may have caused itching and anxiety in the past but symptoms did not resolve completely even after coming off the medication.  I advised her that thyrotoxicosis itself could have caused the symptoms and not necessarily the thionamide.  We started back on methimazole , which she tolerated well.  However, her TFTs remained thyrotoxic and her TSI's were still elevated and she still had occasional itching.  She was contemplating a pregnancy and we discussed about definitive treatment of her thyrotoxicosis/Graves' disease before a possible pregnancy. We performed a thyroid  uptake  first (04/24/2023),  and then a second RAI treatment (05/24/2023).  She developed post ablative hypothyroidism afterwards. - latest thyroid  labs reviewed with pt. >> finally normal at last check Lab Results  Component Value Date   TSH 2.89 03/04/2024  - she continues on LT4 100 mcg daily - pt feels good on this dose.  She mentions heat intolerance, but no other possible signs of thyrotoxicosis.  At last visit she had constipation, possibly also related to iron therapy,  and also hair loss and weight gain of 20 pounds.  At that time, her TSH was 37.72, very high.  We discussed about the need to take levothyroxine  consistently.  We also increased her dose. - we discussed about taking the thyroid  hormone every day, with water, >30 minutes before breakfast, separated by >4 hours from acid reflux medications, calcium, iron, multivitamins. Pt. is taking it correctly. - We discussed about target TSH before and during her pregnancy.  She is now contemplating a pregnancy right away, though. - will check thyroid  tests today: TSH and fT4 - If labs are abnormal, she will need to return for repeat TFTs in 1.5 months - I we will see her back in 6 months or possibly sooner for labs.  Needs refills - 90 days.  Component     Latest Ref Rng 05/13/2024  TSH     mIU/L 1.86   T4,Free(Direct)     0.8 - 1.8 ng/dL 1.5   TFTs remain normal.  Lela Fendt, MD PhD [facility]

## 2024-05-14 ENCOUNTER — Ambulatory Visit: Payer: Self-pay | Admitting: Internal Medicine

## 2024-05-14 MED ORDER — LEVOTHYROXINE SODIUM 100 MCG PO TABS: 100.0000 ug | ORAL_TABLET | Freq: Every day | ORAL | 1 refills | Status: AC

## 2024-05-14 NOTE — Addendum Note (Signed)
 Addended by: TRIXIE FILE on: 05/14/2024 08:20 AM   Modules accepted: Orders

## 2024-05-15 NOTE — Telephone Encounter (Signed)
 Video visit

## 2024-05-21 ENCOUNTER — Telehealth (HOSPITAL_BASED_OUTPATIENT_CLINIC_OR_DEPARTMENT_OTHER): Admitting: Nurse Practitioner

## 2024-05-21 ENCOUNTER — Encounter: Payer: Self-pay | Admitting: Nurse Practitioner

## 2024-05-21 ENCOUNTER — Telehealth: Payer: Self-pay | Admitting: Nurse Practitioner

## 2024-05-21 DIAGNOSIS — F419 Anxiety disorder, unspecified: Secondary | ICD-10-CM | POA: Diagnosis not present

## 2024-05-21 DIAGNOSIS — Z0289 Encounter for other administrative examinations: Secondary | ICD-10-CM | POA: Diagnosis not present

## 2024-05-21 NOTE — Telephone Encounter (Signed)
 Patient has dropped off Health Assessment/ Medical Report form, paperwork has been placed in the providers box for review.

## 2024-05-21 NOTE — Progress Notes (Signed)
 Virtual Visit Consent   Alexandria Frank, you are scheduled for a virtual visit with a [facility] provider today. Just as with appointments in the office, your consent must be obtained to participate. Your consent will be active for this visit and any virtual visit you may have with one of our providers in the next 365 days. If you have a MyChart account, a copy of this consent can be sent to you electronically.  As this is a virtual visit, video technology does not allow for your provider to perform a traditional examination. This may limit your provider's ability to fully assess your condition. If your provider identifies any concerns that need to be evaluated in person or the need to arrange testing (such as labs, EKG, etc.), we will make arrangements to do so. Although advances in technology are sophisticated, we cannot ensure that it will always work on either your end or our end. If the connection with a video visit is poor, the visit may have to be switched to a telephone visit. With either a video or telephone visit, we are not always able to ensure that we have a secure connection.  By engaging in this virtual visit, you consent to the provision of healthcare and authorize for your insurance to be billed (if applicable) for the services provided during this visit. Depending on your insurance coverage, you may receive a charge related to this service.  I need to obtain your verbal consent now. Are you willing to proceed with your visit today? Alexandria Frank has provided verbal consent on 05/21/2024 for a virtual visit (video or telephone). Alexandria LELON Servant, NP  Date: 05/21/2024 9:31 AM   Virtual Visit via Video Note   I, Alexandria Frank, connected with  Alexandria Frank  ([id number], [birth year]) on 05/21/24 at  9:30 AM EDT by a video-enabled telemedicine application and verified that I am speaking with the correct person using two identifiers.  Location: Patient: Virtual Visit Location Patient:  Home Provider: Virtual Visit Location Provider: Home Office   I discussed the limitations of evaluation and management by telemedicine and the availability of in person appointments. The patient expressed understanding and agreed to proceed.    History of Present Illness: Alexandria Frank is a 33 y.o. who identifies as a female who was assigned female at birth, and is being seen today for employer paper work completion.  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 She would not be driving any company vehicles or transporting children. At this time she would be deemed fit for childcare employment. She does have a history of anxiety and is aware that hydroxyzine  which she is taking may cause drowsiness and should not be taken before driving.   Problems:  Patient Active Problem List   Diagnosis Date Noted   S/P partial colectomy 03/02/2022   Tubulovillous adenoma of colon 03/02/2022   Malnutrition of moderate degree 01/26/2022   Intussusception of large intestine (HCC) 01/24/2022   Anemia 04/14/2020   Hyperthyroidism 04/14/2020   Graves disease 01/24/2020    Allergies: No Known Allergies Medications:  Current Outpatient Medications:    hydrOXYzine  (ATARAX ) 25 MG tablet, TAKE 1 TABLET BY MOUTH EVERY 8 HOURS AS NEEDED FOR ANXIETY, Disp: 60 tablet, Rfl: 0   levothyroxine  (SYNTHROID ) 100 MCG tablet, Take 1 tablet (100 mcg total) by mouth daily., Disp: 90 tablet, Rfl: 1  Observations/Objective: Patient is well-developed, well-nourished in no acute distress.  Resting comfortably at home.  Head is normocephalic, atraumatic.  No labored breathing. Speech is clear and coherent with logical content.  Patient is alert and oriented at baseline.    Assessment and Plan: 1. Anxiety (Primary) Continue hydroxyzine  as prescribed  2. Encounter for completion of form with patient  Forms will be completed and patient can pick up in  48 hours.   Follow Up Instructions: I discussed the assessment and treatment plan with the patient. The patient was provided an opportunity to ask questions and all were answered. The patient agreed with the plan and demonstrated an understanding of the instructions.  A copy of instructions were sent to the patient via MyChart unless otherwise noted below.    The patient was advised to call back or seek an in-person evaluation if the symptoms worsen or if the condition fails to improve as anticipated.    Alexandria Ard W Cashlynn Yearwood, NP

## 2024-05-21 NOTE — Telephone Encounter (Signed)
 Noted

## 2024-05-26 ENCOUNTER — Ambulatory Visit: Admitting: Nurse Practitioner

## 2024-06-14 ENCOUNTER — Other Ambulatory Visit: Payer: Self-pay | Admitting: Nurse Practitioner

## 2024-06-14 DIAGNOSIS — F419 Anxiety disorder, unspecified: Secondary | ICD-10-CM

## 2024-06-16 NOTE — Telephone Encounter (Signed)
 Requested Prescriptions  Pending Prescriptions Disp Refills   hydrOXYzine  (ATARAX ) 25 MG tablet [Pharmacy Med Name: HYDROXYZINE  HCL 25 MG TABLET] 60 tablet 0    Sig: TAKE 1 TABLET BY MOUTH EVERY 8 HOURS AS NEEDED FOR ANXIETY     Ear, Nose, and Throat:  Antihistamines 2 Passed - 06/16/2024  3:23 PM      Passed - Cr in normal range and within 360 days    Creatinine, Ser  Date Value Ref Range Status  07/10/2023 1.00 0.57 - 1.00 mg/dL Final         Passed - Valid encounter within last 12 months    Recent Outpatient Visits           3 weeks ago Anxiety   [facility] [place]. [facility] Theotis Haze ORN, NP   11 months ago Encounter for annual physical exam   [facility] [place] [facility] [place]. [facility] Theotis Haze ORN, NP   2 years ago Intussusception of large intestine [facility])   [facility] [place] [facility] [place]. [facility] Theotis Haze ORN, NP   3 years ago Encounter for annual physical exam   [facility] Of [place]. [facility] Theotis Haze ORN, NP   3 years ago Encounter to establish care   [facility] [place] Of [place]. [facility] Theotis Haze ORN, [place]

## 2024-07-06 ENCOUNTER — Encounter: Payer: Self-pay | Admitting: Nurse Practitioner

## 2024-07-07 ENCOUNTER — Other Ambulatory Visit: Payer: Self-pay | Admitting: Nurse Practitioner

## 2024-07-07 DIAGNOSIS — L659 Nonscarring hair loss, unspecified: Secondary | ICD-10-CM

## 2024-07-12 ENCOUNTER — Encounter: Payer: Self-pay | Admitting: Internal Medicine

## 2024-07-14 ENCOUNTER — Encounter: Admitting: Nurse Practitioner

## 2024-07-30 ENCOUNTER — Other Ambulatory Visit: Payer: Self-pay | Admitting: Nurse Practitioner

## 2024-07-30 DIAGNOSIS — F419 Anxiety disorder, unspecified: Secondary | ICD-10-CM

## 2024-08-20 ENCOUNTER — Ambulatory Visit: Admitting: Nurse Practitioner

## 2024-09-04 ENCOUNTER — Other Ambulatory Visit: Payer: Self-pay | Admitting: Family Medicine

## 2024-09-04 DIAGNOSIS — F419 Anxiety disorder, unspecified: Secondary | ICD-10-CM

## 2024-09-20 ENCOUNTER — Other Ambulatory Visit: Payer: Self-pay | Admitting: Family Medicine

## 2024-09-20 DIAGNOSIS — F419 Anxiety disorder, unspecified: Secondary | ICD-10-CM

## 2024-10-03 ENCOUNTER — Telehealth: Payer: Self-pay | Admitting: Nurse Practitioner

## 2024-10-03 NOTE — Telephone Encounter (Signed)
 Contacted pt left vm to confirmed appt

## 2024-10-06 ENCOUNTER — Encounter: Payer: Self-pay | Admitting: Nurse Practitioner

## 2024-10-06 ENCOUNTER — Ambulatory Visit: Admitting: Nurse Practitioner

## 2024-10-13 ENCOUNTER — Other Ambulatory Visit: Payer: Self-pay | Admitting: Nurse Practitioner

## 2024-10-13 DIAGNOSIS — F419 Anxiety disorder, unspecified: Secondary | ICD-10-CM

## 2024-10-13 MED ORDER — HYDROXYZINE HCL 25 MG PO TABS: 25.0000 mg | ORAL_TABLET | Freq: Three times a day (TID) | ORAL | 6 refills | Status: AC | PRN

## 2024-10-13 MED ORDER — SERTRALINE HCL 25 MG PO TABS: 25.0000 mg | ORAL_TABLET | Freq: Every day | ORAL | 3 refills | Status: DC

## 2024-11-05 ENCOUNTER — Other Ambulatory Visit: Payer: Self-pay | Admitting: Nurse Practitioner

## 2024-11-05 DIAGNOSIS — F419 Anxiety disorder, unspecified: Secondary | ICD-10-CM

## 2024-11-13 ENCOUNTER — Ambulatory Visit: Admitting: Internal Medicine
